# Patient Record
Sex: Female | Born: 1945 | Race: White | Hispanic: No | Smoking: Never smoker
Health system: Southern US, Community
[De-identification: ages and names within clinical notes are randomized; demographics above are authoritative.]

## PROBLEM LIST (undated history)

## (undated) DIAGNOSIS — I10 Essential (primary) hypertension: Secondary | ICD-10-CM

## (undated) DIAGNOSIS — T7840XA Allergy, unspecified, initial encounter: Secondary | ICD-10-CM

## (undated) DIAGNOSIS — E785 Hyperlipidemia, unspecified: Secondary | ICD-10-CM

## (undated) DIAGNOSIS — I350 Nonrheumatic aortic (valve) stenosis: Secondary | ICD-10-CM

## (undated) HISTORY — DX: Nonrheumatic aortic (valve) stenosis: I35.0

## (undated) HISTORY — DX: Allergy, unspecified, initial encounter: T78.40XA

## (undated) HISTORY — DX: Hyperlipidemia, unspecified: E78.5

## (undated) HISTORY — DX: Essential (primary) hypertension: I10

## (undated) HISTORY — PX: TUBAL LIGATION: SHX77

---

## 1998-06-24 ENCOUNTER — Other Ambulatory Visit: Admission: RE | Admit: 1998-06-24 | Discharge: 1998-06-24 | Payer: Self-pay | Admitting: Obstetrics & Gynecology

## 1999-07-16 ENCOUNTER — Other Ambulatory Visit: Admission: RE | Admit: 1999-07-16 | Discharge: 1999-07-16 | Payer: Self-pay | Admitting: Obstetrics & Gynecology

## 2000-08-18 ENCOUNTER — Other Ambulatory Visit: Admission: RE | Admit: 2000-08-18 | Discharge: 2000-08-18 | Payer: Self-pay | Admitting: Obstetrics & Gynecology

## 2001-01-14 ENCOUNTER — Encounter: Admission: RE | Admit: 2001-01-14 | Discharge: 2001-01-14 | Payer: Self-pay | Admitting: Family Medicine

## 2001-01-14 ENCOUNTER — Encounter: Payer: Self-pay | Admitting: Family Medicine

## 2001-01-21 ENCOUNTER — Ambulatory Visit (HOSPITAL_COMMUNITY): Admission: RE | Admit: 2001-01-21 | Discharge: 2001-01-21 | Payer: Self-pay | Admitting: Family Medicine

## 2001-09-08 ENCOUNTER — Other Ambulatory Visit: Admission: RE | Admit: 2001-09-08 | Discharge: 2001-09-08 | Payer: Self-pay | Admitting: Obstetrics & Gynecology

## 2002-09-19 ENCOUNTER — Other Ambulatory Visit: Admission: RE | Admit: 2002-09-19 | Discharge: 2002-09-19 | Payer: Self-pay | Admitting: Obstetrics & Gynecology

## 2003-12-05 ENCOUNTER — Other Ambulatory Visit: Admission: RE | Admit: 2003-12-05 | Discharge: 2003-12-05 | Payer: Self-pay | Admitting: Obstetrics & Gynecology

## 2004-08-13 ENCOUNTER — Ambulatory Visit: Payer: Self-pay | Admitting: Internal Medicine

## 2004-09-08 ENCOUNTER — Ambulatory Visit: Payer: Self-pay | Admitting: Internal Medicine

## 2005-01-22 ENCOUNTER — Other Ambulatory Visit: Admission: RE | Admit: 2005-01-22 | Discharge: 2005-01-22 | Payer: Self-pay | Admitting: Obstetrics & Gynecology

## 2005-04-07 ENCOUNTER — Ambulatory Visit: Payer: Self-pay | Admitting: Cardiology

## 2005-06-16 ENCOUNTER — Ambulatory Visit: Payer: Self-pay | Admitting: Cardiology

## 2005-07-01 ENCOUNTER — Ambulatory Visit: Payer: Self-pay | Admitting: Cardiology

## 2005-08-18 ENCOUNTER — Ambulatory Visit: Payer: Self-pay | Admitting: Cardiology

## 2006-01-05 ENCOUNTER — Ambulatory Visit: Payer: Self-pay | Admitting: Cardiology

## 2006-02-10 ENCOUNTER — Ambulatory Visit: Payer: Self-pay | Admitting: Cardiology

## 2006-06-22 ENCOUNTER — Ambulatory Visit: Payer: Self-pay | Admitting: Cardiology

## 2007-07-04 ENCOUNTER — Ambulatory Visit: Payer: Self-pay | Admitting: Cardiology

## 2007-07-06 ENCOUNTER — Ambulatory Visit: Payer: Self-pay | Admitting: Cardiology

## 2007-07-06 LAB — CONVERTED CEMR LAB
ALT: 21 units/L (ref 0–35)
AST: 19 units/L (ref 0–37)
Albumin: 4.2 g/dL (ref 3.5–5.2)
Alkaline Phosphatase: 59 units/L (ref 39–117)
BUN: 27 mg/dL — ABNORMAL HIGH (ref 6–23)
Bilirubin, Direct: 0.1 mg/dL (ref 0.0–0.3)
CO2: 28 meq/L (ref 19–32)
Calcium: 9.9 mg/dL (ref 8.4–10.5)
Chloride: 109 meq/L (ref 96–112)
Cholesterol: 173 mg/dL (ref 0–200)
Creatinine, Ser: 1 mg/dL (ref 0.4–1.2)
Direct LDL: 61.7 mg/dL
GFR calc Af Amer: 72 mL/min
GFR calc non Af Amer: 60 mL/min
Glucose, Bld: 102 mg/dL — ABNORMAL HIGH (ref 70–99)
HDL: 39.1 mg/dL (ref >39.0)
Potassium: 4.7 meq/L (ref 3.5–5.1)
Sodium: 143 meq/L (ref 135–145)
Total Bilirubin: 0.7 mg/dL (ref 0.3–1.2)
Total CHOL/HDL Ratio: 4.4
Total Protein: 6.8 g/dL (ref 6.0–8.3)
Triglycerides: 307 mg/dL (ref 0–149)
VLDL: 61 mg/dL — ABNORMAL HIGH (ref 0–40)

## 2007-10-10 ENCOUNTER — Ambulatory Visit: Payer: Self-pay | Admitting: Cardiology

## 2007-10-10 LAB — CONVERTED CEMR LAB
ALT: 24 units/L (ref 0–35)
AST: 21 units/L (ref 0–37)
Albumin: 3.9 g/dL (ref 3.5–5.2)
Alkaline Phosphatase: 54 units/L (ref 39–117)
Bilirubin, Direct: 0.1 mg/dL (ref 0.0–0.3)
Cholesterol: 161 mg/dL (ref 0–200)
HDL: 34.4 mg/dL — ABNORMAL LOW (ref >39.0)
LDL Cholesterol: 89 mg/dL (ref 0–99)
Total Bilirubin: 0.7 mg/dL (ref 0.3–1.2)
Total CHOL/HDL Ratio: 4.7
Total Protein: 6.9 g/dL (ref 6.0–8.3)
Triglycerides: 190 mg/dL — ABNORMAL HIGH (ref 0–149)
VLDL: 38 mg/dL (ref 0–40)

## 2007-10-11 ENCOUNTER — Ambulatory Visit: Payer: Self-pay | Admitting: Cardiology

## 2008-04-17 ENCOUNTER — Ambulatory Visit: Payer: Self-pay | Admitting: Cardiology

## 2009-08-21 ENCOUNTER — Encounter (INDEPENDENT_AMBULATORY_CARE_PROVIDER_SITE_OTHER): Payer: Self-pay | Admitting: *Deleted

## 2010-06-10 NOTE — Letter (Signed)
Summary: Colonoscopy Letter  [facility]  [address] [place], [place] [address]   Phone: [phone]  Fax: [phone]      August 21, 2009 MRN: [id number]   DANIQUE HARTSOUGH [address] [place], [place]  [address]   Dear Ms. BRIM,   According to your medical record, it is time for you to schedule a Colonoscopy. The American Cancer Society recommends this procedure as a method to detect early colon cancer. Patients with a family history of colon cancer, or a personal history of colon polyps or inflammatory bowel disease are at increased risk.  This letter has beeen generated based on the recommendations made at the time of your procedure. If you feel that in your particular situation this may no longer apply, please contact our office.  Please call our office at [phone] to schedule this appointment or to update your records at your earliest convenience.  Thank you for cooperating with [place] to provide you with the very best care possible.   Sincerely,  Hedwig Morton. Juanda Chance, M.D.  [facility] Gastroenterology Division [phone]

## 2010-06-18 ENCOUNTER — Encounter (INDEPENDENT_AMBULATORY_CARE_PROVIDER_SITE_OTHER): Payer: Self-pay | Admitting: *Deleted

## 2010-06-26 NOTE — Letter (Signed)
Summary: Pre Visit Letter Revised  [facility]  [address] [place], [place] [address]   Phone: [phone]  Fax: [phone]        06/18/2010 MRN: [id number] Alice [name] [address] [place], [place]  [address]             Procedure Date:  07/10/2010 @ 2:00   Recall colon-Dr. Juanda Chance   Welcome to the Gastroenterology Division at [facility].    You are scheduled to see a nurse for your pre-procedure visit on 06/26/2010 at 2:00 on the 3rd floor at [facility], [address]  We ask that you try to arrive at our office 15 minutes prior to your appointment time to allow for check-in.  Please take a minute to review the attached form.  If you answer "Yes" to one or more of the questions on the first page, we ask that you call the person listed at your earliest opportunity.  If you answer "No" to all of the questions, please complete the rest of the form and bring it to your appointment.    Your nurse visit will consist of discussing your medical and surgical history, your immediate family medical history, and your medications.   If you are unable to list all of your medications on the form, please bring the medication bottles to your appointment and we will list them.  We will need to be aware of both prescribed and over the counter drugs.  We will need to know exact dosage information as well.    Please be prepared to read and sign documents such as consent forms, a financial agreement, and acknowledgement forms.  If necessary, and with your consent, a friend or relative is welcome to sit-in on the nurse visit with you.  Please bring your insurance card so that we may make a copy of it.  If your insurance requires a referral to see a specialist, please bring your referral form from your primary care physician.  No co-pay is required for this nurse visit.     If you cannot keep your appointment, please call [phone] to cancel or reschedule prior to  your appointment date.  This allows [place] the opportunity to schedule an appointment for another patient in need of care.    Thank you for choosing [facility] for your medical needs.  We appreciate the opportunity to care for you.  Please visit [place] at our website  to learn more about our practice.  Sincerely, The Gastroenterology Division

## 2010-07-10 ENCOUNTER — Other Ambulatory Visit: Payer: Self-pay | Admitting: Internal Medicine

## 2010-09-23 NOTE — Assessment & Plan Note (Signed)
[facility]                            CARDIOLOGY OFFICE NOTE   Alice [name], Alice [name]                      MRN:          [id number]  DATE:10/11/2007                            DOB:          21-Apr-1946    PRIMARY CARDIOLOGIST:  Dr. Jesse Sans. Wall.   PRIMARY CARE PHYSICIAN:  Dr. Lacretia Nicks. Varney Baas.   HABITS:  Alice [name] is a very delightful 65 year old Caucasian female,  followed by Dr. Daleen Squibb, who returns today status post recent blood work  for hyperlipidemia.  She had laboratory work checked yesterday.  Results  as stated below.  Alice [name] saw Dr. Daleen Squibb back in February and was found  to have dyslipidemia and started on Tri-Chlor.  Since that time she  states she has also started fish oil, three capsules daily. She is  tolerating both medicines without problems.  It is unclear as to why her  triglycerides were so elevated.  Alice [name] states she has been very  diligent in trying to follow a low-carbohydrate diet.  She rarely has  alcohol; if anything, maybe one glass of wine a week.  She has a rather  sedentary life style.  She works in [employer] at [employer], so states she  basically sits for eight hours a day.  She does walk her dog in the  evenings, but otherwise is not very active.  She denies any episodes of  chest discomfort, lightheaded, dizziness, pre-syncope or syncopal  episodes or palpitations.  She states compliance with her medications.   PAST MEDICAL/SURGICAL HISTORY:  1. Mixed hyperlipidemia.  2. Hypertension.  3. Status post cesarean section in 1980.  4. History of colon polyps, status post colonoscopy in 2006.   REVIEW OF SYSTEMS:  As stated above, otherwise negative.   CURRENT MEDICATIONS:  1. Tri-Chlor.  2. Lisinopril/hydrochlorothiazide 20/12.5 mg.  3. Aspirin 81 mg.  4. Fish oil, three cap daily.  5. Vitamin D.   CLINICAL DATA:  Blood work obtained on October 10, 2007:  AST 21, ALT 24,  total cholesterol 161, triglycerides 190, HDL  34.4, LDL 89.   PHYSICAL EXAMINATION:  VITAL SIGNS:  Weight 147 pounds.  Weight is down  3 pounds.  Blood pressure 100/70, heart rate 80.  GENERAL:  Alice [name] was in no acute distress.  HEENT:  Unremarkable.  NECK:  Supple without lymphadenopathy.  No bruits, no jugular venous  distention.  LUNGS:  Clear to auscultation bilaterally.  CARDIOVASCULAR:  S1 and S2.  Regular rate and rhythm.  ABDOMEN:  Soft, nontender.  Positive bowel sounds.  EXTREMITIES:  Lower extremities without clubbing, cyanosis or edema.  Pulses intact.  NEUROLOGIC:  Alert and oriented x3.  SKIN:  Warm and dry.   IMPRESSION:  1. Hypertension, well-controlled.  2. Dyslipidemia, much improvement in blood work since initiation of      Tri-Chlor and fish oil.  3. Sedentary life style:  Once again have encouraged Alice [name] to      increase her activity level.   FOLLOWUP:  Will have Alice [name] follow up with Dr. Daleen Squibb in six months for  a routine visit.  She knows to call sooner if she has any problems.  Blood work has been reviewed with her.  The patient has received a copy.      Dorian Pod, ACNP  Electronically Signed      Jesse Sans. Daleen Squibb, MD, [facility]  Electronically Signed   MB/MedQ  DD: 10/11/2007  DT: 10/11/2007  Job #: [id number]   cc:   Freddy Finner, M.D.

## 2010-09-23 NOTE — Assessment & Plan Note (Signed)
[facility]CARE                            CARDIOLOGY OFFICE NOTE   MATRACA, HUNKINS                      MRN:          [id number]  DATE:07/04/2007                            DOB:          12/31/1945    Ms. Freitas returns today for management of the following issues.  1. Mixed hyperlipidemia.  She had excellent response to Tricor and      diet.  She has some weight back this winter, but will increase her      walking come spring.  She tries to walk about 20 minutes per day.      She is having no angina or ischemic symptoms.  She is due blood      work.  2. Hypertension.  She had an excellent response to lisinopril      hydrochlorothiazide.  She continues have good blood pressure      control.  3. Antiplatelet therapy for primary prevention.   Her meds day, Tricor 148 mg daily, lisinopril hydrochlorothiazide  20/12.5 daily, aspirin 81 mg a day.   Her blood pressure today is 106/66, her pulse is 81 and regular.  Weight  is 150.  HEENT:  Normocephalic, atraumatic.  PERRL.  Extraocular is intact.  Sclera clear.  Facial symmetry is normal.  Dentition satisfactory.  Neck is supple.  Carotids are equal bilateral bruits, no JVD.  Thyroid  is not enlarged.  Trachea is midline.  LUNGS:  Clear.  HEART:  Reveals a nondisplaced PMI.  Normal S1-S2.  ABDOMEN:  Soft, good bowel sounds.  No midline bruits.  EXTREMITIES:  No cyanosis or clubbing.  Trace edema.  Pulses are intact.  NEURO:  Exam is intact.  Skin is unremarkable.   EKG is completely normal except for some nonspecific T-wave changes.   Ms. Plaia is doing well.  I have asked her to increase her walking as  the daylight lengthen and also to try to decrease her weight again.  Will have her return for fasting lipids and LFTs.  Will also check a  Chem-7.   I will plan on seeing her back in a year otherwise.     Thomas C. Daleen Squibb, MD, [facility]  Electronically Signed    TCW/MedQ  DD: 07/04/2007  DT:  07/05/2007  Job #: [id number]   cc:   Freddy Finner, M.D.

## 2010-09-23 NOTE — Assessment & Plan Note (Signed)
[facility]                            CARDIOLOGY OFFICE NOTE   JERENE, YEAGER                      MRN:          [id number]  DATE:04/17/2008                            DOB:          July 15, 1945    Ms. Titterington comes in today for mixed hyperlipidemia.   She was little frustrated that she could not get generic fenofibrate at  [facility].  We will reinvestigate this.  She is currently on TriCor 148  mg a day.   She is also on lisinopril/hydrochlorothiazide 20/12.5 for hypertension  and aspirin 81 mg a day.  She continues with fish oil 1000 mg per day.  She is taking vitamin D.   PHYSICAL EXAMINATION:  VITAL SIGNS:  Her blood pressures have been  excellent and was also very good at Dr. Donnetta Hail office recently.  Today,  it is 100/70, her pulse is 88 and regular, her weight is 152.  HEENT:  Normal.  NECK:  Carotid upstrokes are equal bilaterally without bruits.  No JVD.  Thyroid is not enlarged.  Trachea is midline.  LUNGS:  Clear to auscultation and percussion.  HEART:  Regular rate and rhythm.  No gallop.  ABDOMEN:  Soft, good bowel sounds.  No midline bruit.  There is no  hepatomegaly.  EXTREMITIES:  No cyanosis, clubbing, or edema.  Pulses are intact.  NEUROLOGIC:  Intact.   Ms. Daniel is doing remarkably well.  We have changed her TriCor to  fenofibrate 160 mg a day.  I hope this will save her some money.  Encouraged to keep her weight down and stay active.  We will plan on  seeing her back again in 6 months.     Thomas C. Daleen Squibb, MD, [facility]  Electronically Signed    TCW/MedQ  DD: 04/17/2008  DT: 04/18/2008  Job #: [id number]   cc:   Freddy Finner, M.D.

## 2010-09-26 NOTE — Assessment & Plan Note (Signed)
[facility]                              CARDIOLOGY OFFICE NOTE   Alice [name], Alice [name]                      MRN:          [id number]  DATE:01/05/2006                            DOB:          [birth year]    HISTORY OF PRESENT ILLNESS:  The patient returns today for further followup  of her mixed hyperlipidemia, family history of stroke in her father, and  border hypertension.  We noticed her blood pressure was elevated on her last  visit.  She has had one elevated reading since then at another doctor's  office.   Because her triglycerides did not drop despite a fairly strict diet, we  added Tricor 148 mg daily.  Her total cholesterol has now dropped from 216  to 270 and triglycerides from 402 to 174.  HDL has increased from 42 to 44  (but was initially in the mid 30s) and her LDL is below 100 at 91.  I think  we have one falsely low reading of LDL at 51.5 in the past.   She feels remarkably well.   PHYSICAL EXAMINATION:  VITAL SIGNS:  Her blood pressure is 140/78 today.  Pulse 78 and regular.  I rechecked it and it was actually 160.  Her weight  is 148 which is down 2 pounds.  GENERAL:  She is in no acute distress.  NECK:  Carotid upstrokes are equal bilaterally without bruits.  There is no  JVD.  Thyroid is not palpable or enlarged or tender.  LUNGS:  Clear.  HEART:  Reveals regular rate and rhythm without an S4.  ABDOMEN:  Exam is soft.  No midline bruits.  EXTREMITIES:  No cyanosis, clubbing or edema.  Peripheral pulses are  present.   EKG:  EKG is essentially normal.   ASSESSMENT AND PLAN:  1. Mixed hyperlipidemia with excellent response with diet and with Tricor.      I am very pleased with her increase in HDL and near normalization of      her triglycerides and lowering of her total cholesterol as well.  2. Probable hypertension.   PLAN:  1. Check TSH.  2. Check BNP.  3. Check blood pressures at [facility] and give [place] a call.   If she is      running systolics consistently above 135, we will implement anti-      hypertensive therapy.  This is true,      particularly, in light of her family history of stroke in her dad.      Otherwise, we will plan on seeing her back in six months.  We renewed      her Tricor.                               Thomas C. Daleen Squibb, MD, [facility]    TCW/MedQ  DD:  01/05/2006  DT:  01/06/2006  Job #:  [id number]   cc:   Freddy Finner, MD

## 2010-09-26 NOTE — Assessment & Plan Note (Signed)
[facility]                            CARDIOLOGY OFFICE NOTE   SCHWANDA, ZIMA                      MRN:          [id number]  DATE:06/22/2006                            DOB:          01-10-1946    Ms. Senat returns today for further management of the following issues:  1. Mixed hyperlipidemia. She has had an excellent response to Tricor      and diet. She is due lipids and LFTs in April. Please see my      previous note.  2. Hypertension. She has had a brilliant response to      lisinopril/hydrochlorothiazide 20/12.5. Chemistries have been      within normal limits on this medication in October 2007.  3. She is also on aspirin 81 mg a day.   She has no ischemic symptoms or cardiovascular complaints today. She  denies any orthopnea, PND or peripheral edema.   Her blood pressure is 110/70. Pulse 88 and regular. She is in sinus  rhythm with nonspecific ST segment changes. This is stable.   Her weight is 146, down 2.  HEENT: Normocephalic, atraumatic. PERRLA. Extra-ocular movements intact.  Sclerae are clear.  NECK: Is supple.  There is no JVD.  Thyroid is not enlarged. Trachea is  midline.  LUNGS:  Are clear.  HEART: Reveals a regular rate and rhythm without gallop, rub, or murmur.  ABDOMEN: Soft with good bowel sounds. There is no hepatic tenderness or  hepatic enlargement.  EXTREMITIES: Reveals no edema. Pulses are intact.  NEURO: Intact.   ASSESSMENT/PLAN:  Mrs. Curington is doing well on the current medical  program. She offers no cardiovascular complaints when questioned. I will  have her return in April for a comprehensive metabolic panel and lipids.  Will plan on seeing her back in a year otherwise.     Thomas C. Daleen Squibb, MD, [facility]  Electronically Signed    TCW/MedQ  DD: 06/22/2006  DT: 06/22/2006  Job #: [id number]   cc:   Freddy Finner, M.D.

## 2010-11-26 ENCOUNTER — Telehealth: Payer: Self-pay | Admitting: *Deleted

## 2010-11-26 NOTE — Telephone Encounter (Signed)
Patient is overdue for colonoscopy for follow up of adenomatous polyps seen on 2006 procedure. Patient was scheduled for colonoscopy in March 2012, but cancelled. I have left a message for her to call back.

## 2010-11-27 NOTE — Telephone Encounter (Signed)
-----   Message -----    From: Holli Humbles    Sent: 11/27/2010   9:08 AM      To: Vernia Buff, CMA  Pt. returned call. She is out of town and will CB next week when she returns.

## 2011-04-29 ENCOUNTER — Ambulatory Visit (INDEPENDENT_AMBULATORY_CARE_PROVIDER_SITE_OTHER): Payer: BC Managed Care – PPO

## 2011-04-29 DIAGNOSIS — N898 Other specified noninflammatory disorders of vagina: Secondary | ICD-10-CM

## 2011-04-29 DIAGNOSIS — N760 Acute vaginitis: Secondary | ICD-10-CM

## 2011-10-20 ENCOUNTER — Other Ambulatory Visit: Payer: Self-pay | Admitting: Internal Medicine

## 2012-01-05 ENCOUNTER — Ambulatory Visit (INDEPENDENT_AMBULATORY_CARE_PROVIDER_SITE_OTHER): Payer: Medicare Other | Admitting: Family Medicine

## 2012-01-05 VITALS — BP 120/68 | HR 91 | Temp 98.8°F | Resp 16 | Ht 63.0 in | Wt 164.0 lb

## 2012-01-05 DIAGNOSIS — H109 Unspecified conjunctivitis: Secondary | ICD-10-CM

## 2012-01-05 MED ORDER — OFLOXACIN 0.3 % OP SOLN: OPHTHALMIC | Status: AC

## 2012-01-05 NOTE — Progress Notes (Signed)
Subjective: Patient has been having problems with pain in her right eye since she got up this morning. She took her contacts out. She has been doing a lot of yard work. Knows of no foreign bodies.  Objective: Mild redness of. No foreign body seen right eye.. fluoresceined screen was done and no abrasions are noted.  Assessment: Conjunctivitis  Plan: Ofloxacin eyedrops. Return if problems.

## 2012-01-05 NOTE — Patient Instructions (Signed)
Return if worse or no better.   Contacts out until it feels okay for 1-2 days  Use drops as directed

## 2012-01-16 ENCOUNTER — Other Ambulatory Visit: Payer: Self-pay | Admitting: Internal Medicine

## 2012-01-17 NOTE — Telephone Encounter (Signed)
Please pull chart.

## 2012-01-18 NOTE — Telephone Encounter (Signed)
Needs office visit before out

## 2012-01-18 NOTE — Telephone Encounter (Signed)
Chart pulled to PA pool at nurse station [phone]

## 2012-03-16 ENCOUNTER — Encounter: Payer: Self-pay | Admitting: Internal Medicine

## 2012-03-16 ENCOUNTER — Ambulatory Visit (INDEPENDENT_AMBULATORY_CARE_PROVIDER_SITE_OTHER): Payer: Medicare Other | Admitting: Internal Medicine

## 2012-03-16 VITALS — BP 108/78 | HR 81 | Temp 98.1°F | Resp 16 | Ht 63.75 in | Wt 165.6 lb

## 2012-03-16 DIAGNOSIS — R8281 Pyuria: Secondary | ICD-10-CM

## 2012-03-16 DIAGNOSIS — R82998 Other abnormal findings in urine: Secondary | ICD-10-CM

## 2012-03-16 DIAGNOSIS — Z0000 Encounter for general adult medical examination without abnormal findings: Secondary | ICD-10-CM

## 2012-03-16 DIAGNOSIS — E785 Hyperlipidemia, unspecified: Secondary | ICD-10-CM

## 2012-03-16 DIAGNOSIS — E781 Pure hyperglyceridemia: Secondary | ICD-10-CM | POA: Insufficient documentation

## 2012-03-16 DIAGNOSIS — J309 Allergic rhinitis, unspecified: Secondary | ICD-10-CM

## 2012-03-16 DIAGNOSIS — Z23 Encounter for immunization: Secondary | ICD-10-CM

## 2012-03-16 DIAGNOSIS — Z6827 Body mass index (BMI) 27.0-27.9, adult: Secondary | ICD-10-CM | POA: Insufficient documentation

## 2012-03-16 DIAGNOSIS — I10 Essential (primary) hypertension: Secondary | ICD-10-CM

## 2012-03-16 DIAGNOSIS — Z6828 Body mass index (BMI) 28.0-28.9, adult: Secondary | ICD-10-CM

## 2012-03-16 DIAGNOSIS — B356 Tinea cruris: Secondary | ICD-10-CM

## 2012-03-16 LAB — POCT URINALYSIS DIPSTICK
Bilirubin, UA: NEGATIVE
Glucose, UA: NEGATIVE
Ketones, UA: NEGATIVE
Leukocytes, UA: NEGATIVE
pH, UA: 7

## 2012-03-16 LAB — CBC WITH DIFFERENTIAL/PLATELET
Basophils Absolute: 0.1 10*3/uL (ref 0.0–0.1)
Lymphocytes Relative: 30 % (ref 12–46)
Lymphs Abs: 3 10*3/uL (ref 0.7–4.0)
Neutro Abs: 5.7 10*3/uL (ref 1.7–7.7)
Neutrophils Relative %: 56 % (ref 43–77)
Platelets: 279 10*3/uL (ref 150–400)
RBC: 4.98 MIL/uL (ref 3.87–5.11)
WBC: 10.1 10*3/uL (ref 4.0–10.5)

## 2012-03-16 LAB — COMPREHENSIVE METABOLIC PANEL
ALT: 22 U/L (ref 0–35)
AST: 22 U/L (ref 0–37)
CO2: 27 mEq/L (ref 19–32)
Calcium: 9.5 mg/dL (ref 8.4–10.5)
Chloride: 101 mEq/L (ref 96–112)
Sodium: 138 mEq/L (ref 135–145)
Total Protein: 7.2 g/dL (ref 6.0–8.3)

## 2012-03-16 LAB — LIPID PANEL
Cholesterol: 163 mg/dL (ref 0–200)
VLDL: 54 mg/dL — ABNORMAL HIGH (ref 0–40)

## 2012-03-16 LAB — POCT UA - MICROSCOPIC ONLY

## 2012-03-16 MED ORDER — KETOCONAZOLE 2 % EX CREA: TOPICAL_CREAM | Freq: Every day | CUTANEOUS | Status: DC

## 2012-03-16 MED ORDER — MOMETASONE FUROATE 50 MCG/ACT NA SUSP: 2.0000 | Freq: Every day | NASAL | Status: DC

## 2012-03-16 MED ORDER — LORATADINE 10 MG PO TBDP: 10.0000 mg | ORAL_TABLET | Freq: Every day | ORAL | Status: DC

## 2012-03-16 NOTE — Progress Notes (Deleted)
  Subjective:    Patient ID: Alice [name], female    DOB: 10/18/1945, 66 y.o.   MRN: [id number]  HPI    Review of Systems     Objective:   Physical Exam            Assessment & Plan:

## 2012-03-16 NOTE — Progress Notes (Signed)
  Subjective:    Patient ID: Alice [name], female    DOB: November 28, 1945, 66 y.o.   MRN: [id number]  HPI    Review of Systems  Constitutional: Negative.   HENT: Positive for postnasal drip.   Eyes: Negative.   Respiratory: Negative.   Cardiovascular: Negative.   Gastrointestinal: Negative.   Genitourinary: Negative.   Musculoskeletal: Negative.   Skin: Negative.   Neurological: Negative.   Hematological: Negative.   Psychiatric/Behavioral: Negative.        Objective:   Physical Exam        Assessment & Plan:

## 2012-03-16 NOTE — Progress Notes (Addendum)
  Subjective:    Patient ID: Alice [name], female    DOB: 04-23-1946, 66 y.o.   MRN: [id number]  CC: 66 yo W F here for routine healthcare and c/o postnasal drip.  HPI Patient Active Problem List  Diagnosis  . Hyperlipidemia  . Allergic rhinitis  . HTN (hypertension)  . BMI 28.0-28.9,adult     Pt is retired and has been enjoying life overall and is here for routine healthcare and post nasal drip.  She c/o of a lump in the back of her throat.  She has tried Flonase, Mucinex and saline spray w/o resolution.  She does not have to blow her nose or cough regularly.  She denies trouble w/sinus pressure and hoarseness.  However, she does c/o a bad taste in her mouth and indigestion. She has no true dysphagia.   Pt c/o itching under her arms and in her groin area.  She denies any abnormal discharge.  This is been present for many months despite the things she has experimented with.  She was treated with Flagyl w/in the past year for BV.  We discussed the use of probiotics to prevent recurring BV.  She says that she recently started on probiotics and will check to see if the ones she bought contain the recommended probiotics. She is not sexually active and has no vaginal discharge. Her nuisance symptoms include a feeling of warmness and slight irritation.   Pt has had her last mammogram w/in the past yr and the USPSTF currently recommends screening mammograms q 66yrs for her age group.    Immunizations up todate   Review of Systems Noncontributory///see record under CMA   No depression/no risk of falls/no pending vision difficulties Objective:   Physical Exam General: 66 yo F appears stated age and is pleasant and cooperative. Vitals:  Filed Vitals:   03/16/12 1503  BP: 108/78  Pulse: 81  Temp: 98.1 F (36.7 C)  Resp: 16  HEENT: Nontraumatic,PERRLA, EOM conj. Nares boggy with allergic signs/oral pharynx clear/non-nodes or thyromegaly Heart: RRR/No murmur or carotid bruit Axillary  areas without rash Breasts without masses or tenderness Lungs: CTA bilaterally Abdomen with no organomegaly or masses Mild hyperpig areas In inguinal area bilaterally Where moist Spine straight Straight leg raise negative Extremities with no edema/peripheral pulses full Neurological intact Mood stable/affect good     Assessment & Plan:  Annual physical examination  1. Annual physical exam    2. Need for prophylactic vaccination and inoculation against influenza    3. Hyperlipidemia    4. Allergic rhinitis    5. HTN (hypertension)    6. Tinea cruris    7. Pyuria    8. BMI 28.0-28.9,adult      1)Axillary pruritus uncertain etiology-to try trial variety of deodorants 2) Recurrent BV-Discussed preventing BV w/ probiotics and which types of probiotics to select. 3) Tinea ? In groin-Trial of Nizoral 4) Allergies -Probable cause of throat symptoms  Will try Claritin plus Nasonex for one to 2 months and if not resolved will consider ENT referral  5) Pap smear at December 2012 within normal limits 6) colonoscopy next due 2016 7) Weight loss needed 8) will send in regular medications after labs reviewed

## 2012-03-16 NOTE — Patient Instructions (Addendum)
Use one spray in each nostril twice a day of nasonex use nizoral for the groin 1-2 times a week if needed Bifidobacterium plus lactobacillus in the probiotics([employer])

## 2012-03-18 ENCOUNTER — Encounter: Payer: Self-pay | Admitting: Internal Medicine

## 2012-03-18 LAB — URINE CULTURE
Colony Count: NO GROWTH
Organism ID, Bacteria: NO GROWTH

## 2012-03-18 MED ORDER — LISINOPRIL-HYDROCHLOROTHIAZIDE 20-12.5 MG PO TABS: 1.0000 | ORAL_TABLET | Freq: Every day | ORAL | Status: DC

## 2012-03-18 MED ORDER — FENOFIBRATE 160 MG PO TABS: 160.0000 mg | ORAL_TABLET | Freq: Every day | ORAL | Status: DC

## 2012-03-18 NOTE — Addendum Note (Signed)
Addended by: Tonye Pearson on: 03/18/2012 02:32 PM   Modules accepted: Orders

## 2012-06-15 ENCOUNTER — Other Ambulatory Visit: Payer: Self-pay | Admitting: Physician Assistant

## 2013-01-17 ENCOUNTER — Other Ambulatory Visit: Payer: Self-pay | Admitting: Physician Assistant

## 2013-02-20 ENCOUNTER — Ambulatory Visit (INDEPENDENT_AMBULATORY_CARE_PROVIDER_SITE_OTHER): Payer: Medicare Other | Admitting: *Deleted

## 2013-02-20 DIAGNOSIS — Z23 Encounter for immunization: Secondary | ICD-10-CM

## 2013-04-12 ENCOUNTER — Ambulatory Visit (INDEPENDENT_AMBULATORY_CARE_PROVIDER_SITE_OTHER): Payer: Medicare Other | Admitting: Internal Medicine

## 2013-04-12 ENCOUNTER — Encounter: Payer: Self-pay | Admitting: Internal Medicine

## 2013-04-12 VITALS — BP 124/70 | HR 88 | Temp 98.2°F | Resp 16 | Ht 63.75 in | Wt 169.2 lb

## 2013-04-12 DIAGNOSIS — J309 Allergic rhinitis, unspecified: Secondary | ICD-10-CM

## 2013-04-12 DIAGNOSIS — E785 Hyperlipidemia, unspecified: Secondary | ICD-10-CM

## 2013-04-12 DIAGNOSIS — Z0000 Encounter for general adult medical examination without abnormal findings: Secondary | ICD-10-CM

## 2013-04-12 DIAGNOSIS — I10 Essential (primary) hypertension: Secondary | ICD-10-CM

## 2013-04-12 DIAGNOSIS — Z23 Encounter for immunization: Secondary | ICD-10-CM

## 2013-04-12 DIAGNOSIS — Z6828 Body mass index (BMI) 28.0-28.9, adult: Secondary | ICD-10-CM

## 2013-04-12 LAB — POCT URINALYSIS DIPSTICK
Glucose, UA: NEGATIVE
Nitrite, UA: NEGATIVE
Urobilinogen, UA: 0.2
pH, UA: 7

## 2013-04-12 LAB — CBC
MCH: 31 pg (ref 26.0–34.0)
MCHC: 34.4 g/dL (ref 30.0–36.0)
Platelets: 299 10*3/uL (ref 150–400)

## 2013-04-12 LAB — COMPREHENSIVE METABOLIC PANEL
ALT: 27 U/L (ref 0–35)
Alkaline Phosphatase: 76 U/L (ref 39–117)
Sodium: 142 mEq/L (ref 135–145)
Total Bilirubin: 0.5 mg/dL (ref 0.3–1.2)
Total Protein: 7 g/dL (ref 6.0–8.3)

## 2013-04-12 LAB — POCT UA - MICROSCOPIC ONLY
Casts, Ur, LPF, POC: NEGATIVE
Mucus, UA: POSITIVE
Yeast, UA: NEGATIVE

## 2013-04-12 LAB — LIPID PANEL
LDL Cholesterol: 63 mg/dL (ref 0–99)
VLDL: 74 mg/dL — ABNORMAL HIGH (ref 0–40)

## 2013-04-12 NOTE — Progress Notes (Signed)
   Subjective:    Patient ID: Alice [name], female    DOB: 04/25/1946, 67 y.o.   MRN: [id number]  HPI    Review of Systems  Constitutional: Negative.   HENT: Positive for postnasal drip.   Eyes: Negative.   Respiratory: Negative.   Cardiovascular: Negative.   Gastrointestinal: Negative.   Endocrine: Negative.   Genitourinary: Negative.   Musculoskeletal: Positive for back pain.  Skin: Negative.   Allergic/Immunologic: Negative.   Neurological: Negative.   Hematological: Negative.   Psychiatric/Behavioral: Negative.        Objective:   Physical Exam        Assessment & Plan:

## 2013-04-12 NOTE — Progress Notes (Signed)
   Subjective:    Patient ID: Alice [name], female    DOB: 1945/08/19, 67 y.o.   MRN: [id number]  HPIhere for cpe  Last CPE- 12/13 Last mammo- 12/12 Dentist- 8/14 Eye- tomorrow [place]- 2006 Flu- 10/14 Pneumovax- will have today  Has occasional back pain. Relieved with tylenol. Exercises at [facility] [employer]. Has noticed slight weight gain despite exercise. This has occurred with retirement 1 1/2 years ago.  Has post nasal drip in the fall. Improved with nasonex and OTC antihistamines.  Nocturia x1  No further rash.  Review of Systems  Constitutional: Negative for fever, activity change, appetite change, fatigue and unexpected weight change.  HENT: Negative.   Eyes: Negative.   Respiratory: Negative for cough, chest tightness and shortness of breath.   Cardiovascular: Negative for chest pain, palpitations and leg swelling.  Gastrointestinal: Negative.   Endocrine: Negative.   Genitourinary: Negative.   Allergic/Immunologic: Negative.   Neurological: Negative.   Hematological: Negative.   Psychiatric/Behavioral: Negative.        Objective:   Physical Exam  Nursing note and vitals reviewed. Constitutional: She is oriented to person, place, and time. She appears well-developed and well-nourished. No distress.  HENT:  Head: Normocephalic.  Right Ear: Tympanic membrane, external ear and ear canal normal.  Left Ear: Tympanic membrane, external ear and ear canal normal.  Nose: Nose normal.  Mouth/Throat: Oropharynx is clear and moist.  Eyes: Conjunctivae and EOM are normal. Pupils are equal, round, and reactive to light.  Neck: Normal range of motion. Neck supple. No thyromegaly present.  Cardiovascular: Normal rate, regular rhythm, normal heart sounds and intact distal pulses.   No murmur heard. No bruit  Pulmonary/Chest: Effort normal and breath sounds normal.  Abdominal: Soft. Bowel sounds are normal. She exhibits no mass. There is no tenderness.  Musculoskeletal: Normal range of  motion. She exhibits no edema and no tenderness.  Lymphadenopathy:    She has no cervical adenopathy.  Neurological: She is alert and oriented to person, place, and time. She has normal reflexes. No cranial nerve deficit.  Skin: Skin is warm and dry. No rash noted. No erythema.  Psychiatric: She has a normal mood and affect. Her behavior is normal. Judgment and thought content normal.       Assessment & Plan:  Routine general medical examination at a health care facility - Plan: POCT UA - Microscopic Only, POCT urinalysis dipstick  Hyperlipidemia- continue fenofibrate  HTN (hypertension)- continue lisinopril-hydrochlorothiazide 20-12.5  Allergic rhinitis- continue nasonex, loratadine  BMI 28.0-28.9,adult  I have completed the patient encounter in its entirety as documented by FNP Leone Payor, with editing by me where necessary. Kaelynn Igo P. Merla Riches, M.D.

## 2013-04-14 MED ORDER — LISINOPRIL-HYDROCHLOROTHIAZIDE 20-12.5 MG PO TABS: 1.0000 | ORAL_TABLET | Freq: Every day | ORAL | Status: DC

## 2013-04-14 MED ORDER — MOMETASONE FUROATE 50 MCG/ACT NA SUSP: 2.0000 | Freq: Every day | NASAL | Status: DC

## 2013-04-14 MED ORDER — FENOFIBRATE 160 MG PO TABS: 160.0000 mg | ORAL_TABLET | Freq: Every day | ORAL | Status: DC

## 2013-04-19 ENCOUNTER — Encounter: Payer: Self-pay | Admitting: Internal Medicine

## 2013-06-01 ENCOUNTER — Encounter: Payer: Self-pay | Admitting: Internal Medicine

## 2014-01-10 ENCOUNTER — Encounter: Payer: Self-pay | Admitting: *Deleted

## 2014-01-12 ENCOUNTER — Ambulatory Visit (INDEPENDENT_AMBULATORY_CARE_PROVIDER_SITE_OTHER): Payer: Medicare Other | Admitting: Family Medicine

## 2014-01-12 VITALS — BP 132/82 | HR 84 | Temp 98.2°F | Resp 17 | Ht 64.0 in | Wt 173.0 lb

## 2014-01-12 DIAGNOSIS — S90569A Insect bite (nonvenomous), unspecified ankle, initial encounter: Secondary | ICD-10-CM

## 2014-01-12 DIAGNOSIS — W57XXXA Bitten or stung by nonvenomous insect and other nonvenomous arthropods, initial encounter: Secondary | ICD-10-CM

## 2014-01-12 DIAGNOSIS — R21 Rash and other nonspecific skin eruption: Secondary | ICD-10-CM

## 2014-01-12 MED ORDER — TRIAMCINOLONE ACETONIDE 0.1 % EX CREA: 1.0000 "application " | TOPICAL_CREAM | Freq: Two times a day (BID) | CUTANEOUS | Status: DC

## 2014-01-12 NOTE — Patient Instructions (Signed)
try the steroid cream twice per day, aveeno lotion if needed, zyrtec or benadryl if needed for itching - but these can cause sedation and dizziness, so use caution in taking this medicine.   If any spread or worsening of your rash , or new or worsening symptoms - return for recheck.   If rash not improved in next 2 weeks - recheck.

## 2014-01-12 NOTE — Progress Notes (Signed)
Subjective:    Patient ID: Alice [name], female    DOB: 1945-08-27, 68 y.o.   MRN: [id number]  This chart was scribed for Meredith Staggers, MD by Gwenevere Abbot, ED scribe. This patient was seen in room Room/bed 3 and the patient's care was started at 12:17 PM.  WUJ:[id number], Alice Lemon, MD  Chief Complaint  Patient presents with  . Rash    ALL over body      HPI Alice [name] is a 68 y.o. female who presents to [facility] with concerns of rashes on the upper and lower extremities.   Pt reports that she became sunburned on her upper arms bilaterally, approximately 1 month ago, with associated symptoms of itching, bumps, and redness. Pt reports that she is still dealing with symptoms. Pt reports that she has attempted to use cortisone, without relief.   Pt noticed spots on ankles bilaterally, predominantly on the right, onset two weeks ago. Pt reports that she has experienced redness. Pt denies using any new lotions or detergents prior to rash onset. Pt reports that she has changed products since onset in attempt to relieve symptoms, without relief.   Patient Active Problem List   Diagnosis Date Noted  . Hyperlipidemia 03/16/2012  . Allergic rhinitis 03/16/2012  . HTN (hypertension) 03/16/2012  . BMI 28.0-28.9,adult 03/16/2012   No past medical history on file. Past Surgical History  Procedure Laterality Date  . Cesarean section    . Tubal ligation     No Known Allergies Prior to Admission medications   Medication Sig Start Date End Date Taking? Authorizing Provider  aspirin 81 MG tablet Take 81 mg by mouth daily.   Yes Historical Provider, MD  fenofibrate 160 MG tablet Take 1 tablet (160 mg total) by mouth daily. 04/14/13  Yes Tonye Pearson, MD  fish oil-omega-3 fatty acids 1000 MG capsule Take 2 g by mouth daily.   Yes Historical Provider, MD  ketoconazole (NIZORAL) 2 % cream Apply topically daily. 03/16/12  Yes Tonye Pearson, MD  Lactobacillus (PROBIOTIC ACIDOPHILUS PO)  Take by mouth.   Yes Historical Provider, MD  lisinopril-hydrochlorothiazide (PRINZIDE,ZESTORETIC) 20-12.5 MG per tablet Take 1 tablet by mouth daily. 04/14/13  Yes Tonye Pearson, MD  loratadine (CLARITIN REDITABS) 10 MG dissolvable tablet Take 1 tablet (10 mg total) by mouth daily. 03/16/12  Yes Tonye Pearson, MD  mometasone (NASONEX) 50 MCG/ACT nasal spray Place 2 sprays into the nose daily. 04/14/13  Yes Tonye Pearson, MD  OVER THE COUNTER MEDICATION Vitamin D 5000 iu once daily   Yes Historical Provider, MD   History   Social History  . Marital Status: Unknown    Spouse Name: N/A    Number of Children: N/A  . Years of Education: N/A   Occupational History  . Not on file.   Social History Main Topics  . Smoking status: Never Smoker   . Smokeless tobacco: Not on file  . Alcohol Use: Yes  . Drug Use: No  . Sexual Activity: Not on file   Other Topics Concern  . Not on file   Social History Narrative   Divorced. Education: [employer]. Exercise: Yes.    Review of Systems  Skin:       Itchiness       Objective:   Physical Exam  Nursing note and vitals reviewed. Constitutional: She is oriented to person, place, and time. She appears well-developed and well-nourished.  HENT:  Head: Normocephalic and atraumatic.  Eyes:  EOM are normal.  Neck: Normal range of motion. Neck supple.  Cardiovascular: Normal rate.   Pulmonary/Chest: Effort normal.  Musculoskeletal: Normal range of motion.  Neurological: She is alert and oriented to person, place, and time.  Skin: Skin is warm and dry. Rash noted. There is erythema.  Lower Extremities: A few slighty erythemitis patches, not hyper pigmented on the right and left lower legs. No surrounding erythema. No lesions between toes, or on feet. No ulceration.   Upper Arms: Right upper arm, approximately two small patches, 2 cm in diameter with slight excoriation and thickening. Left upper arm, approximately 5 excoriated patches,  slightly dry skin, ranging from 2-3 mm up to 1.5 cm, on the dorsal surface of the upper arm bilaterally. No surrounding erythema or vesicles.   Psychiatric: She has a normal mood and affect. Her behavior is normal.        Filed Vitals:   01/12/14 1119  BP: 132/82  Pulse: 84  Temp: 98.2 F (36.8 C)  TempSrc: Oral  Resp: 17  Height:  (1.626 m)  Weight: 173 lb (78.472 kg)  SpO2: 92%    Assessment & Plan:   Alice [name] is a 68 y.o. female Rash and nonspecific skin eruption - Plan: triamcinolone cream (KENALOG) 0.1 %  Insect bite of ankle, unspecified laterality, initial encounter - Plan: triamcinolone cream (KENALOG) 0.1 %  Suspect insect bites of ankles, and few dry patches upper arms - DDX contact derm vs psoriasis/guttate psoriasis possible with scale. Trial of topical triamcinolone, aveeno otc prn, and antihistamine if needed, but SED. recheck in next 2 weeks if not improving - sooner if worse.   Meds ordered this encounter  Medications  . triamcinolone cream (KENALOG) 0.1 %    Sig: Apply 1 application topically 2 (two) times daily.    Dispense:  30 g    Refill:  0   Patient Instructions  try the steroid cream twice per day, aveeno lotion if needed, zyrtec or benadryl if needed for itching - but these can cause sedation and dizziness, so use caution in taking this medicine.   If any spread or worsening of your rash , or new or worsening symptoms - return for recheck.   If rash not improved in next 2 weeks - recheck.      I personally performed the services described in this documentation, which was scribed in my presence. The recorded information has been reviewed and considered, and addended by me as needed.

## 2014-03-13 ENCOUNTER — Ambulatory Visit: Payer: Medicare Other | Admitting: *Deleted

## 2014-03-13 ENCOUNTER — Ambulatory Visit (INDEPENDENT_AMBULATORY_CARE_PROVIDER_SITE_OTHER): Payer: Medicare Other | Admitting: *Deleted

## 2014-03-13 DIAGNOSIS — Z23 Encounter for immunization: Secondary | ICD-10-CM

## 2014-05-09 ENCOUNTER — Ambulatory Visit (INDEPENDENT_AMBULATORY_CARE_PROVIDER_SITE_OTHER): Payer: Medicare Other

## 2014-05-09 ENCOUNTER — Encounter: Payer: Self-pay | Admitting: Internal Medicine

## 2014-05-09 ENCOUNTER — Ambulatory Visit (INDEPENDENT_AMBULATORY_CARE_PROVIDER_SITE_OTHER): Payer: Medicare Other | Admitting: Internal Medicine

## 2014-05-09 VITALS — BP 128/73 | HR 88 | Temp 97.9°F | Resp 16 | Ht 64.5 in | Wt 170.0 lb

## 2014-05-09 DIAGNOSIS — M545 Low back pain, unspecified: Secondary | ICD-10-CM

## 2014-05-09 DIAGNOSIS — Z0000 Encounter for general adult medical examination without abnormal findings: Secondary | ICD-10-CM

## 2014-05-09 DIAGNOSIS — I10 Essential (primary) hypertension: Secondary | ICD-10-CM

## 2014-05-09 DIAGNOSIS — J309 Allergic rhinitis, unspecified: Secondary | ICD-10-CM

## 2014-05-09 DIAGNOSIS — Z1211 Encounter for screening for malignant neoplasm of colon: Secondary | ICD-10-CM

## 2014-05-09 DIAGNOSIS — E785 Hyperlipidemia, unspecified: Secondary | ICD-10-CM

## 2014-05-09 DIAGNOSIS — Z23 Encounter for immunization: Secondary | ICD-10-CM

## 2014-05-09 LAB — POCT URINALYSIS DIPSTICK
Bilirubin, UA: NEGATIVE
Blood, UA: NEGATIVE
Glucose, UA: NEGATIVE
Ketones, UA: NEGATIVE
Nitrite, UA: NEGATIVE
PROTEIN UA: NEGATIVE
SPEC GRAV UA: 1.02
Urobilinogen, UA: 0.2
pH, UA: 6.5

## 2014-05-09 LAB — CBC
HCT: 40.9 % (ref 36.0–46.0)
Hemoglobin: 14.4 g/dL (ref 12.0–15.0)
MCH: 31.2 pg (ref 26.0–34.0)
MCHC: 35.2 g/dL (ref 30.0–36.0)
MCV: 88.5 fL (ref 78.0–100.0)
MPV: 9.4 fL (ref 8.6–12.4)
Platelets: 317 10*3/uL (ref 150–400)
RBC: 4.62 MIL/uL (ref 3.87–5.11)
RDW: 13.7 % (ref 11.5–15.5)
WBC: 7.2 10*3/uL (ref 4.0–10.5)

## 2014-05-09 MED ORDER — FENOFIBRATE 160 MG PO TABS: 160.0000 mg | ORAL_TABLET | Freq: Every day | ORAL | Status: DC

## 2014-05-09 MED ORDER — LISINOPRIL-HYDROCHLOROTHIAZIDE 20-12.5 MG PO TABS: 1.0000 | ORAL_TABLET | Freq: Every day | ORAL | Status: DC

## 2014-05-09 NOTE — Progress Notes (Signed)
MRN: [id number]   Subjective:   [name] is a 68 y.o. female presenting for annual physical exam.  Medical care team includes:  PCP: Tonye PearsonOLITTLE, Tamasha Laplante P, MD, last annual physical 04/2013. [facility]: last seen 1 year ago, appointment next week with Dr. London SheerPeter [facility]: last seen 12/2013, Dr. [place]had [facility]yl [facility]: used to be Dr. Lloyd HugerNeil, has not been back in years, never had any significant findings apart from a small benign mass ~20 years ago. Last mammogram was 04/2013. Specialists:   Colonoscopy: last done 2006, had one polyp, on 10 year follow up.  Bone density scan done in 2012, no significant findings.  [name] has Hyperlipidemia; Allergic rhinitis; HTN (hypertension); and BMI 28.0-28.9,adult on her problem list.  Concerns:  Back pain - states several week history of low back pain associated with walking. Patient states that it is sharp and achy in nature, relieved with resting, Tylenol. Denies fevers, weight loss, back pain at night, radiation of pain into legs, urinary of fecal incontinence, sensory changes.   HTN - lis-HCT, takes it daily, does not check BP. Denies hypotension, dizziness, chest pain, heart racing, palpitations, visual disturbance, urinary changes, lower extremity edema. Tries to eat healthily but admits that she has a hard time, could eat better, does Zumba classes at [employer]. Denies smoking, occasional alcohol use.  HL - managed with fenofibrate, denies adverse effects, diet and exercise as above.   Gas - has occasional bloating, increased burping and gas, occurs randomly, not associated with food, no n/v, diarrhea, abdominal pain, sour brash, denies history of heartburn. She does drink milk but recently switched to lactose free milk. Has had similar symptoms in the past, resolved with taking Probiotic, she restarted probiotic in the last week.  Seasonal allergies - admits postnasal drip, occasional cough, wakes up in the morning with congestion  relieved by blowing her nose. Used to take Claritin for this but not taking currently.  Immunizations: Flu vaccine , Td 05/11/2009, Pneumo 23 on 04/12/2013, Shingles 12/09/2009.  [name] has a current medication list which includes the following prescription(s): aspirin, coconut oil, fenofibrate, fish oil-omega-3 fatty acids, lactobacillus, and lisinopril-hydrochlorothiazide.  She has No Known Allergies  [name]  has a past medical history of Hypertension; Hyperlipidemia; and Allergy.  Also  has past surgical history that includes Cesarean section and Tubal ligation.  ROS As in subjective.  Objective:   Vitals: BP 128/73 mmHg  Pulse 88  Temp(Src) 97.9 F (36.6 C)  Resp 16  Ht 5' 4.5" (1.638 m)  Wt 170 lb (77.111 kg)  BMI 28.74 kg/m2  SpO2 96%  BP Readings from Last 3 Encounters:  05/09/14 128/73  01/12/14 132/82  04/12/13 124/70   Wt Readings from Last 3 Encounters:  05/09/14 170 lb (77.111 kg)  01/12/14 173 lb (78.472 kg)  04/12/13 169 lb 3.2 oz (76.749 kg)   Physical Exam  Constitutional: She is oriented to person, place, and time and well-developed, well-nourished, and in no distress.  Eyes: Conjunctivae and EOM are normal. Pupils are equal, round, and reactive to light. Right eye exhibits no discharge. Left eye exhibits no discharge. No scleral icterus.  Cardiovascular: Normal rate, regular rhythm, normal heart sounds and intact distal pulses.  Exam reveals no gallop and no friction rub.   No murmur heard. Pulmonary/Chest: Effort normal and breath sounds normal. No respiratory distress. She has no wheezes. She has no rales. She exhibits no tenderness.  Abdominal: Soft. Bowel sounds are normal.  She exhibits no distension and no mass. There is no tenderness.  Musculoskeletal: Normal range of motion. She exhibits no edema or tenderness.  Neurological: She is alert and oriented to person, place, and time.  Skin: Skin is warm and dry. No rash noted. She is not  diaphoretic. No erythema.  Psychiatric: Mood and affect normal.   [facility] reading (PRIMARY) by  Dr. Merla Richesoolittle and PA-Mani. Lumbar spine: Degenerative changes at level of L4-L5, questionable spondylolisthesis at L5-S1, no lytic lesions or evidence of fracture.   Dg Lumbar Spine 2-3 Views  05/09/2014   CLINICAL DATA:  Back pain  EXAM: LUMBAR SPINE - 2-3 VIEW  COMPARISON:  None.  FINDINGS: The lumbar vertebrae are in normal alignment. Intervertebral disc spaces appear normal. There is very minimal anterolisthesis of L5 on S1 by a 4 mm. There are degenerative changes involving the facet joints particularly of L4-5 and L5-S1. No compression deformity is seen. The SI joints are corticated.  IMPRESSION: 1. 4 mm anterolisthesis of L5 on S1 with degenerative change involving the facet joints of the lower lumbar spine. 2. Normal intervertebral disc spaces.   Electronically Signed   By: Dwyane DeePaul  Barry M.D.   On: 05/09/2014 15:42    Results for orders placed or performed in visit on 05/09/14 (from the past 24 hour(s))  POCT urinalysis dipstick     Status: None   Collection Time: 05/09/14  1:44 PM  Result Value Ref Range   Color, UA Amber    Clarity, UA Clear    Glucose, UA negative    Bilirubin, UA Negative    Ketones, UA Negative    Spec Grav, UA 1.020    Blood, UA Negative    pH, UA 6.5    Protein, UA Negative    Urobilinogen, UA 0.2    Nitrite, UA Negative    Leukocytes, UA Negative    Assessment and Plan :   Discussed healthy lifestyle, diet, exercise, preventative care, vaccinations, and addressed patient's concerns. Plan for follow up in 1 year for repeat annual exam. Otherwise, plan for specific conditions below.  1. Annual physical exam - stable, anticipatory guidance provided - labs pending  2. Midline low back pain without sciatica - likely due to mild degenerative changes as noted on xray, deconditioned core - advised that patient take tylenol as needed for back pain, start yoga, core  strengthening for at least 15 minutes daily - f/u in 6-8 weeks if no improvement in symptoms(PT next) - DG Lumbar Spine 2-3 Views; Future  3. Hyperlipidemia - stable, continue fenofibrate, advised better diet and exercise  4. Essential hypertension - stable, continue lis-HCT, diet and exercise as above  5. Allergic rhinitis, unspecified allergic rhinitis type - Advised to start Flonase, f/u as above  6. Colon cancer screening - Due for screening, referral to Gastroenterology  7. Need for vaccination with 13-polyvalent pneumococcal conjugate vaccine - Pneumococcal conjugate vaccine 13-valent IM  I have participated in the care of this patient with the Advanced Practice Provider and agree with Diagnosis and Plan as documented. Dang Mathison P. Merla Richesoolittle, M.D. Assisted by: Wallis BambergMario Mani, PA-C [facility] [phone] 05/09/2014 1:03 PM

## 2014-05-10 LAB — COMPREHENSIVE METABOLIC PANEL
ALT: 19 U/L (ref 0–35)
AST: 14 U/L (ref 0–37)
Albumin: 4.2 g/dL (ref 3.5–5.2)
Alkaline Phosphatase: 71 U/L (ref 39–117)
BUN: 32 mg/dL — AB (ref 6–23)
CO2: 26 meq/L (ref 19–32)
Calcium: 9.9 mg/dL (ref 8.4–10.5)
Chloride: 104 mEq/L (ref 96–112)
Creat: 0.86 mg/dL (ref 0.50–1.10)
Glucose, Bld: 115 mg/dL — ABNORMAL HIGH (ref 70–99)
Potassium: 4.2 mEq/L (ref 3.5–5.3)
Sodium: 141 mEq/L (ref 135–145)
Total Bilirubin: 0.6 mg/dL (ref 0.2–1.2)
Total Protein: 6.8 g/dL (ref 6.0–8.3)

## 2014-05-10 LAB — LIPID PANEL
CHOL/HDL RATIO: 4.5 ratio
Cholesterol: 149 mg/dL (ref 0–200)
HDL: 33 mg/dL — ABNORMAL LOW (ref >39)
TRIGLYCERIDES: 409 mg/dL — AB (ref <150)

## 2014-05-10 LAB — TSH: TSH: 2.575 u[IU]/mL (ref 0.350–4.500)

## 2014-05-15 ENCOUNTER — Encounter: Payer: Self-pay | Admitting: Internal Medicine

## 2014-05-30 ENCOUNTER — Other Ambulatory Visit: Payer: Self-pay | Admitting: Internal Medicine

## 2014-06-08 ENCOUNTER — Encounter: Payer: Self-pay | Admitting: Internal Medicine

## 2014-09-03 ENCOUNTER — Encounter: Payer: Self-pay | Admitting: *Deleted

## 2014-09-06 ENCOUNTER — Encounter: Payer: Self-pay | Admitting: Internal Medicine

## 2014-09-18 ENCOUNTER — Ambulatory Visit (AMBULATORY_SURGERY_CENTER): Payer: Self-pay

## 2014-09-18 VITALS — Ht 64.0 in | Wt 175.6 lb

## 2014-09-18 DIAGNOSIS — Z1211 Encounter for screening for malignant neoplasm of colon: Secondary | ICD-10-CM

## 2014-09-18 MED ORDER — NA SULFATE-K SULFATE-MG SULF 17.5-3.13-1.6 GM/177ML PO SOLN: ORAL | Status: DC

## 2014-09-18 NOTE — Progress Notes (Signed)
Per pt, no allergies to soy or egg products.Pt not taking any weight loss meds or using  O2 at home. 

## 2014-09-20 ENCOUNTER — Encounter: Payer: Self-pay | Admitting: Internal Medicine

## 2014-10-01 ENCOUNTER — Encounter: Payer: Self-pay | Admitting: Internal Medicine

## 2014-10-03 ENCOUNTER — Encounter: Payer: Self-pay | Admitting: Internal Medicine

## 2014-10-03 ENCOUNTER — Ambulatory Visit (AMBULATORY_SURGERY_CENTER): Payer: Medicare Other | Admitting: Internal Medicine

## 2014-10-03 VITALS — BP 122/65 | HR 74 | Temp 98.6°F | Resp 26 | Ht 64.0 in | Wt 175.0 lb

## 2014-10-03 DIAGNOSIS — Z1211 Encounter for screening for malignant neoplasm of colon: Secondary | ICD-10-CM | POA: Diagnosis present

## 2014-10-03 MED ORDER — SODIUM CHLORIDE 0.9 % IV SOLN: 500.0000 mL | INTRAVENOUS | Status: DC

## 2014-10-03 NOTE — Progress Notes (Signed)
To Pacu. Awake and alert. Pleased with MAC. Report to RN 

## 2014-10-03 NOTE — Patient Instructions (Signed)
YOU HAD AN ENDOSCOPIC PROCEDURE TODAY AT THE [facility]:   Refer to the procedure report that was given to you for any specific questions about what was found during the examination.  If the procedure report does not answer your questions, please call your gastroenterologist to clarify.  If you requested that your care partner not be given the details of your procedure findings, then the procedure report has been included in a sealed envelope for you to review at your convenience later.  YOU SHOULD EXPECT: Some feelings of bloating in the abdomen. Passage of more gas than usual.  Walking can help get rid of the air that was put into your GI tract during the procedure and reduce the bloating. If you had a lower endoscopy (such as a colonoscopy or flexible sigmoidoscopy) you may notice spotting of blood in your stool or on the toilet paper. If you underwent a bowel prep for your procedure, you may not have a normal bowel movement for a few days.  Please Note:  You might notice some irritation and congestion in your nose or some drainage.  This is from the oxygen used during your procedure.  There is no need for concern and it should clear up in a day or so.  SYMPTOMS TO REPORT IMMEDIATELY:   Following lower endoscopy (colonoscopy or flexible sigmoidoscopy):  Excessive amounts of blood in the stool  Significant tenderness or worsening of abdominal pains  Swelling of the abdomen that is new, acute  Fever of 100F or higher  For urgent or emergent issues, a gastroenterologist can be reached at any hour by calling [phone].  DIET: Your first meal following the procedure should be a small meal and then it is ok to progress to your normal diet. Heavy or fried foods are harder to digest and may make you feel nauseous or bloated.  Likewise, meals heavy in dairy and vegetables can increase bloating.  Drink plenty of fluids but you should avoid alcoholic beverages for 24 hours.  ACTIVITY:   You should plan to take it easy for the rest of today and you should NOT DRIVE or use heavy machinery until tomorrow (because of the sedation medicines used during the test).    FOLLOW UP: Our staff will call the number listed on your records the next business day following your procedure to check on you and address any questions or concerns that you may have regarding the information given to you following your procedure. If we do not reach you, we will leave a message.  However, if you are feeling well and you are not experiencing any problems, there is no need to return our call.  We will assume that you have returned to your regular daily activities without incident.  SIGNATURES/CONFIDENTIALITY: You and/or your care partner have signed paperwork which will be entered into your electronic medical record.  These signatures attest to the fact that that the information above on your After Visit Summary has been reviewed and is understood.  Full responsibility of the confidentiality of this discharge information lies with you and/or your care-partner.  Continue your normal medications  Please read over handouts about diverticulosis and high fiber diets  10 year recall

## 2014-10-03 NOTE — Op Note (Signed)
[facility] [address] [place], [id number]   COLONOSCOPY PROCEDURE REPORT  PATIENT: Dede QueryRubio, Alice W  MR#: [id number] BIRTHDATE: 15-Apr-1946 , 69  yrs. old GENDER: female ENDOSCOPIST: Hart Carwinora M Pricsilla Lindvall, MD REFERRED XB:JYNWGNBY:Robert Merla Richesoolittle, M.D. PROCEDURE DATE:  10/03/2014 PROCEDURE:   Colonoscopy, screening First Screening Colonoscopy - Avg.  risk and is 50 yrs.  old or older - No.  Prior Negative Screening - Now for repeat screening. 10 or more years since last screening  History of Adenoma - Now for follow-up colonoscopy & has been > or = to 3 yrs.  N/A  Polyps removed today? No Recommend repeat exam, <10 yrs? No ASA CLASS:   Class II INDICATIONS:Screening for colonic neoplasia and Colorectal Neoplasm Risk Assessment for this procedure is average risk. MEDICATIONS: Monitored anesthesia care and Propofol 250 mg IV  DESCRIPTION OF PROCEDURE:   After the risks benefits and alternatives of the procedure were thoroughly explained, informed consent was obtained.  The digital rectal exam revealed no abnormalities of the rectum.   The LB PFC-H190 [id number]  endoscope was introduced through the anus and advanced to the cecum, which was identified by both the appendix and ileocecal valve. No adverse events experienced.   The quality of the prep was good.  (MoviPrep was used)  The instrument was then slowly withdrawn as the colon was fully examined. Estimated blood loss is zero unless otherwise noted in this procedure report.      COLON FINDINGS: There was moderate diverticulosis noted throughout the entire examined colon with associated muscular hypertrophy, angulation and tortuosity.  Retroflexed views revealed no abnormalities. The time to cecum = 6.04 Withdrawal time = 6.08 The scope was withdrawn and the procedure completed. COMPLICATIONS: There were no immediate complications.  ENDOSCOPIC IMPRESSION: There was moderate diverticulosis noted throughout the  entire examined colon  RECOMMENDATIONS: High fiber diet Recall colonoscopy in 10 years  eSigned:  Hart Carwinora M Jennesis Ramaswamy, MD 10/03/2014 3:32 PM   cc:

## 2014-10-04 ENCOUNTER — Telehealth: Payer: Self-pay | Admitting: *Deleted

## 2014-10-04 NOTE — Telephone Encounter (Signed)
  Follow up Call-  Call back number 10/03/2014  Post procedure Call Back phone  # [phone]  Permission to leave phone message Yes     Patient questions:  Do you have a fever, pain , or abdominal swelling? No. Pain Score  0 *  Have you tolerated food without any problems? Yes.    Have you been able to return to your normal activities? Yes.    Do you have any questions about your discharge instructions: Diet   No. Medications  No. Follow up visit  No.  Do you have questions or concerns about your Care? No.  Actions: * If pain score is 4 or above: No action needed, pain <4.

## 2015-02-09 ENCOUNTER — Encounter (HOSPITAL_COMMUNITY): Payer: Self-pay | Admitting: Emergency Medicine

## 2015-02-09 ENCOUNTER — Emergency Department (HOSPITAL_COMMUNITY)
Admission: EM | Admit: 2015-02-09 | Discharge: 2015-02-09 | Disposition: A | Discharge status: Home / Self Care | Payer: Medicare Other | Attending: Emergency Medicine | Admitting: Emergency Medicine

## 2015-02-09 ENCOUNTER — Ambulatory Visit (INDEPENDENT_AMBULATORY_CARE_PROVIDER_SITE_OTHER): Payer: Medicare Other | Admitting: Family Medicine

## 2015-02-09 ENCOUNTER — Emergency Department (HOSPITAL_COMMUNITY): Payer: Medicare Other

## 2015-02-09 VITALS — BP 142/90 | HR 100 | Temp 98.6°F | Resp 16 | Ht 64.0 in | Wt 181.0 lb

## 2015-02-09 DIAGNOSIS — Z79899 Other long term (current) drug therapy: Secondary | ICD-10-CM | POA: Insufficient documentation

## 2015-02-09 DIAGNOSIS — I10 Essential (primary) hypertension: Secondary | ICD-10-CM | POA: Diagnosis not present

## 2015-02-09 DIAGNOSIS — Z23 Encounter for immunization: Secondary | ICD-10-CM | POA: Diagnosis not present

## 2015-02-09 DIAGNOSIS — Z7982 Long term (current) use of aspirin: Secondary | ICD-10-CM | POA: Diagnosis not present

## 2015-02-09 DIAGNOSIS — R200 Anesthesia of skin: Secondary | ICD-10-CM

## 2015-02-09 DIAGNOSIS — R0789 Other chest pain: Secondary | ICD-10-CM

## 2015-02-09 DIAGNOSIS — Z8639 Personal history of other endocrine, nutritional and metabolic disease: Secondary | ICD-10-CM | POA: Diagnosis not present

## 2015-02-09 DIAGNOSIS — R208 Other disturbances of skin sensation: Secondary | ICD-10-CM

## 2015-02-09 DIAGNOSIS — R202 Paresthesia of skin: Secondary | ICD-10-CM | POA: Diagnosis present

## 2015-02-09 LAB — CBC
HEMATOCRIT: 45.2 % (ref 36.0–46.0)
HEMOGLOBIN: 15.3 g/dL — AB (ref 12.0–15.0)
MCH: 31.7 pg (ref 26.0–34.0)
MCHC: 33.8 g/dL (ref 30.0–36.0)
MCV: 93.8 fL (ref 78.0–100.0)
Platelets: 269 10*3/uL (ref 150–400)
RBC: 4.82 MIL/uL (ref 3.87–5.11)
RDW: 13.3 % (ref 11.5–15.5)
WBC: 8.4 10*3/uL (ref 4.0–10.5)

## 2015-02-09 LAB — COMPREHENSIVE METABOLIC PANEL
ALBUMIN: 4.4 g/dL (ref 3.5–5.0)
ALK PHOS: 72 U/L (ref 38–126)
ALT: 38 U/L (ref 14–54)
ANION GAP: 8 (ref 5–15)
AST: 33 U/L (ref 15–41)
BILIRUBIN TOTAL: 0.7 mg/dL (ref 0.3–1.2)
BUN: 23 mg/dL — AB (ref 6–20)
CALCIUM: 9.9 mg/dL (ref 8.9–10.3)
CO2: 24 mmol/L (ref 22–32)
Chloride: 105 mmol/L (ref 101–111)
Creatinine, Ser: 0.82 mg/dL (ref 0.44–1.00)
GFR calc Af Amer: >60 mL/min (ref >60)
GFR calc non Af Amer: >60 mL/min (ref >60)
GLUCOSE: 133 mg/dL — AB (ref 65–99)
Potassium: 4.3 mmol/L (ref 3.5–5.1)
SODIUM: 137 mmol/L (ref 135–145)
Total Protein: 7.6 g/dL (ref 6.5–8.1)

## 2015-02-09 LAB — PROTIME-INR
INR: 0.9 (ref 0.00–1.49)
Prothrombin Time: 12.4 seconds (ref 11.6–15.2)

## 2015-02-09 LAB — I-STAT TROPONIN, ED: TROPONIN I, POC: 0.01 ng/mL (ref 0.00–0.08)

## 2015-02-09 MED ORDER — SODIUM CHLORIDE 0.9 % IV SOLN
1000.0000 mL | INTRAVENOUS | Status: DC
  Administered 2015-02-09: 1000 mL via INTRAVENOUS

## 2015-02-09 NOTE — ED Provider Notes (Signed)
CSN: [id number]     Arrival date & time 02/09/15  1206 History   First MD Initiated Contact with Patient 02/09/15 1216     Chief Complaint  Patient presents with  . Tingling   History was obtained from the patient as well as the medical record from her doctor's visit today HPI Patient presents to the emergency room for evaluation of chest discomfort associated with left arm tingling. Symptoms started couple weeks ago. Has noticed some intermittent discomfort in the epigastric region and chest. Patient states symptoms will come and go maybe lasting minutes at a time. She has noticed some increased gassiness and burping but does not correlate those particular episodes with the pain. She has not had any trouble with shortness of breath. She denies any trouble with any fevers cough or swelling. Patient does do exercise on occasion, zumba t classes, and has not noticed that activity causes any increased discomfort.  She does notice sometimes it's worse when she is lying flat.  She is also noted some tingling in the left arm. She denies any trouble with numbness or weakness. She denies any trouble with her speech balance or coordination.  Patient was seen by her primary doctor today. Additional evaluation in the emergency room was recommended. Past Medical History  Diagnosis Date  . Hypertension   . Hyperlipidemia   . Allergy    Past Surgical History  Procedure Laterality Date  . Cesarean section      1 time  . Tubal ligation     Family History  Problem Relation Age of Onset  . Diabetes Mother   . Heart disease Father   . Stroke Father    Social History  Substance Use Topics  . Smoking status: Never Smoker   . Smokeless tobacco: Never Used  . Alcohol Use: No   OB History    No data available     Review of Systems  All other systems reviewed and are negative.     Allergies  Review of patient's allergies indicates no known allergies.  Home Medications   Prior to Admission  medications   Medication Sig Start Date End Date Taking? Authorizing Provider  aspirin 81 MG tablet Take 81 mg by mouth daily.   Yes Historical Provider, MD  calcium carbonate (TUMS - DOSED IN MG ELEMENTAL CALCIUM) 500 MG chewable tablet Chew 1 tablet by mouth daily as needed for indigestion or heartburn.   Yes Historical Provider, MD  DiphenhydrAMINE HCl (ALKA-SELTZER PLUS ALLERGY PO) Take by mouth as needed (allergies.).    Yes Historical Provider, MD  fenofibrate 160 MG tablet Take 1 tablet (160 mg total) by mouth daily. 05/09/14  Yes Wallis Bamberg, PA-C  fish oil-omega-3 fatty acids 1000 MG capsule Take 1 g by mouth daily.    Yes Historical Provider, MD  Lactobacillus (PROBIOTIC ACIDOPHILUS PO) Take by mouth.   Yes Historical Provider, MD  lisinopril-hydrochlorothiazide (PRINZIDE,ZESTORETIC) 20-12.5 MG per tablet Take 1 tablet by mouth daily. 05/09/14  Yes Wallis Bamberg, PA-C   BP 95/71 mmHg  Pulse 82  Temp(Src) 97.8 F (36.6 C) (Oral)  Resp 17  SpO2 96% Physical Exam  Constitutional: She is oriented to person, place, and time. She appears well-developed and well-nourished. No distress.  HENT:  Head: Normocephalic and atraumatic.  Right Ear: External ear normal.  Left Ear: External ear normal.  Mouth/Throat: Oropharynx is clear and moist.  Eyes: Conjunctivae are normal. Right eye exhibits no discharge. Left eye exhibits no discharge. No scleral icterus.  Neck: Neck supple. No tracheal deviation present.  Cardiovascular: Normal rate, regular rhythm and intact distal pulses.   Pulmonary/Chest: Effort normal and breath sounds normal. No stridor. No respiratory distress. She has no wheezes. She has no rales.  Abdominal: Soft. Bowel sounds are normal. She exhibits no distension. There is no tenderness. There is no rebound and no guarding.  Musculoskeletal: She exhibits no edema or tenderness.  Neurological: She is alert and oriented to person, place, and time. She has normal strength. No  cranial nerve deficit (No facial droop, extraocular movements intact, tongue midline ) or sensory deficit. She exhibits normal muscle tone. She displays no seizure activity. Coordination normal.  No pronator drift bilateral upper extrem, able to hold both legs off bed for 5 seconds, sensation intact in all extremities, no visual field cuts, no left or right sided neglect, no nystagmus noted   Skin: Skin is warm and dry. No rash noted.  Psychiatric: She has a normal mood and affect.  Nursing note and vitals reviewed.   ED Course  Procedures (including critical care time) Labs Review Labs Reviewed  CBC - Abnormal; Notable for the following:    Hemoglobin 15.3 (*)    All other components within normal limits  COMPREHENSIVE METABOLIC PANEL - Abnormal; Notable for the following:    Glucose, Bld 133 (*)    BUN 23 (*)    All other components within normal limits  PROTIME-INR  I-STAT TROPOININ, ED    Imaging Review Dg Chest 2 View  02/09/2015   CLINICAL DATA:  Chest pain, left arm tingling x1 day, hypertension  EXAM: CHEST - 2 VIEW  COMPARISON:  None available  FINDINGS: Lungs clear.  Borderline cardiomegaly.  No pneumothorax. No effusion. Minimal spurring in the mid thoracic spine.  IMPRESSION: No acute cardiopulmonary disease.   Electronically Signed   By: Corlis Leak M.D.   On: 02/09/2015 13:17   I have personally reviewed and evaluated these images and lab results as part of my medical decision-making.   EKG Interpretation   Date/Time:  Saturday February 09 2015 12:54:07 EDT Ventricular Rate:  83 PR Interval:  175 QRS Duration: 85 QT Interval:  362 QTC Calculation: 425 R Axis:   4 Text Interpretation:  Sinus rhythm Borderline T abnormalities, anterior  leads No old tracing to compare Confirmed by Lyndell Gillyard  MD-J, Hutson Luft ([id number]) on  02/09/2015 1:00:41 PM      MDM   Final diagnoses:  Chest pain, atypical   Sx are atypical for heart disease.  Heart score is three.  Low risk for ACS.   Doubt cardiac etiology.  Normal neuro exam.  Not suggestive of stroke, TIA.  At this time there does not appear to be any evidence of an acute emergency medical condition and the patient appears stable for discharge with appropriate outpatient follow up.  Consider outpatient follow up stress test.    Linwood Dibbles, MD 02/09/15 1527

## 2015-02-09 NOTE — ED Notes (Signed)
Pt reported tingling to Lt arm that started yesterday but has been constant today. (+)PMS, CRT brisk, full ROM, denies injury/trauma, no bruising/deformity/swelling noted. Pt reported epigastic pain with pressure. Pt stated that it was worse after meals. Pt denies diaphoresis, SHOB, n/v and radiation.

## 2015-02-09 NOTE — Discharge Instructions (Signed)

## 2015-02-09 NOTE — Patient Instructions (Signed)
Please proceed to the ER of your choice for further evaluation - go straight there  I am hopeful that all will look ok and that you will be able to go home.   Let us know if you need any help arranging for cardiology follow-up after your ER evaluation

## 2015-02-09 NOTE — ED Notes (Signed)
Awake. Verbally responsive. A/O x4. Resp even and unlabored. No audible adventitious breath sounds noted. ABC's intact. SR on monitor. IV infusing NS at 20ml/hr without difficulty. 

## 2015-02-09 NOTE — ED Notes (Signed)
Patient transported to X-ray 

## 2015-02-09 NOTE — Progress Notes (Signed)
[facility] [address], [place] [address] [phone]  Date:  02/09/2015   Name:  Alice [name]   DOB:  Nov 09, 1945   MRN:  [id number]  PCP:  Tonye Pearson, MD    Chief Complaint: Chest Pain   History of Present Illness:  Alice [name] is a 69 y.o. very pleasant female patient who presents with the following:  Here today with concern of chest pain and left arm pain She has not felt that good over the last couple of weeks- felt like she had indigestion, was burping more than usual.   She has noted some tingling in her left arm yeserday and today.  No weakness She notes tenderenss in the epigastric area and sometimes under the left breast- she will sometimes have pain after a fatty meal but can occur other times as well She tried antiacids- this helps her some No SOB No nausea, no diarrhea but she had had some loose stools and gassiness  She has a history of hyperlipidemia and HTN. She does NOT have DM.   In her family, her father did have a stroke in his 4s and CAD at age 51. No other family history that she knows of  She has never been a smoker She does walk and do zumba some of the time- she has not noted worsening of the pain with exercise but admits she does not do a lot of physical activyt She does not currently have the CP but she does have the persistent tingling in her left arm She has not noted any other neurological sx  Patient Active Problem List   Diagnosis Date Noted  . Hyperlipidemia 03/16/2012  . Allergic rhinitis 03/16/2012  . HTN (hypertension) 03/16/2012  . BMI 28.0-28.9,adult 03/16/2012    Past Medical History  Diagnosis Date  . Hypertension   . Hyperlipidemia   . Allergy     Past Surgical History  Procedure Laterality Date  . Cesarean section      1 time  . Tubal ligation      Social History  Substance Use Topics  . Smoking status: Never Smoker   . Smokeless tobacco: Never Used  . Alcohol Use: No     Family History  Problem Relation Age of Onset  . Diabetes Mother   . Heart disease Father   . Stroke Father     No Known Allergies  Medication list has been reviewed and updated.  Current Outpatient Prescriptions on File Prior to Visit  Medication Sig Dispense Refill  . aspirin 81 MG tablet Take 81 mg by mouth daily.    . DiphenhydrAMINE HCl (ALKA-SELTZER PLUS ALLERGY PO) Take by mouth as needed.    . fenofibrate 160 MG tablet Take 1 tablet (160 mg total) by mouth daily. 90 tablet 3  . fish oil-omega-3 fatty acids 1000 MG capsule Take 1 g by mouth daily.     . Lactobacillus (PROBIOTIC ACIDOPHILUS PO) Take by mouth.    Marland Kitchen lisinopril-hydrochlorothiazide (PRINZIDE,ZESTORETIC) 20-12.5 MG per tablet Take 1 tablet by mouth daily. 90 tablet 3  . Coconut Oil 1000 MG CAPS Take by mouth.     No current facility-administered medications on file prior to visit.    Review of Systems:  As per HPI- otherwise negative.   Physical Examination: Filed Vitals:   02/09/15 1016  BP: 142/90  Pulse: 100  Temp: 98.6 F (37 C)  Resp: 16   Filed Vitals:   02/09/15 1016  Height:  (1.626 m)  Weight: 181 lb (82.101 kg)   Body mass index is 31.05 kg/(m^2). Ideal Body Weight: Weight in (lb) to have BMI = 25: 145.3  GEN: WDWN, NAD, Non-toxic, A & O x 3, obese, looks well HEENT: Atraumatic, Normocephalic. Neck supple. No masses, No LAD. Ears and Nose: No external deformity. CV: RRR, No M/G/R. No JVD. No thrill. No extra heart sounds. PULM: CTA B, no wheezes, crackles, rhonchi. No retractions. No resp. distress. No accessory muscle use. ABD: S, ND, +BS. No rebound. No HSM.   Minimal epigastric tenderness EXTR: No c/c/e NEURO Normal gait.  Normal strength and DTR of all extrmities She continues to notice a feeling of tingling in the left forearm and hand, this is not changed by movement of the arm or neck PSYCH: Normally interactive. Conversant. Not depressed or anxious appearing.  Calm  demeanor.   EKG:  NSR, no ST changes Assessment and Plan: Chest wall pain - Plan: EKG 12-Lead  Needs flu shot - Plan: Flu Vaccine QUAD 36+ mos IM (Fluarix & Fluzone Quad PF  Left arm numbness  Here today with atypical chest pain and left arm tingling. No CP at this time, and her EKG is normal.  Discussed with pt in detail. I suspect that this is a benign issue and likely related to GERD, but cardiac or stroke sx could also present in this way. Would prefer her to go to the ER for further evaluation.  She is willing to do this, declines EMS transport.  She prefers to go to [facility]- she understands that if a more serious cardiac issue is discovered that she will have to be transferred to [facility]   Signed Abbe Amsterdam, MD

## 2015-04-08 ENCOUNTER — Encounter: Payer: Self-pay | Admitting: Internal Medicine

## 2015-04-16 ENCOUNTER — Telehealth: Payer: Self-pay | Admitting: Family Medicine

## 2015-04-16 NOTE — Telephone Encounter (Signed)
LEFT A MESSAGE FOR PATIENT TO RETURN CALL.  SHE HAD HER LAST CPE DONE ON 05/09/14 SO WE NEED TO RESCHEDULE HER CPE FOR AFTER THE NEW YEAR FOR HER MEDICARE TO PAY FOR IT.

## 2015-04-17 ENCOUNTER — Encounter: Payer: Self-pay | Admitting: Internal Medicine

## 2015-05-31 ENCOUNTER — Other Ambulatory Visit: Payer: Self-pay | Admitting: Urgent Care

## 2015-06-07 ENCOUNTER — Ambulatory Visit (INDEPENDENT_AMBULATORY_CARE_PROVIDER_SITE_OTHER): Payer: Medicare Other | Admitting: Internal Medicine

## 2015-06-07 ENCOUNTER — Encounter: Payer: Self-pay | Admitting: Internal Medicine

## 2015-06-07 VITALS — BP 145/77 | HR 92 | Temp 98.2°F | Resp 16 | Ht 63.5 in | Wt 179.0 lb

## 2015-06-07 DIAGNOSIS — Z0000 Encounter for general adult medical examination without abnormal findings: Secondary | ICD-10-CM | POA: Diagnosis not present

## 2015-06-07 DIAGNOSIS — Z6828 Body mass index (BMI) 28.0-28.9, adult: Secondary | ICD-10-CM | POA: Diagnosis not present

## 2015-06-07 DIAGNOSIS — E785 Hyperlipidemia, unspecified: Secondary | ICD-10-CM

## 2015-06-07 DIAGNOSIS — Z1239 Encounter for other screening for malignant neoplasm of breast: Secondary | ICD-10-CM

## 2015-06-07 DIAGNOSIS — I10 Essential (primary) hypertension: Secondary | ICD-10-CM | POA: Diagnosis not present

## 2015-06-07 DIAGNOSIS — J3089 Other allergic rhinitis: Secondary | ICD-10-CM | POA: Diagnosis not present

## 2015-06-07 DIAGNOSIS — E781 Pure hyperglyceridemia: Secondary | ICD-10-CM | POA: Diagnosis not present

## 2015-06-07 DIAGNOSIS — Z6831 Body mass index (BMI) 31.0-31.9, adult: Secondary | ICD-10-CM

## 2015-06-07 DIAGNOSIS — Z1159 Encounter for screening for other viral diseases: Secondary | ICD-10-CM | POA: Diagnosis not present

## 2015-06-07 LAB — CBC WITH DIFFERENTIAL/PLATELET
BASOS ABS: 0 10*3/uL (ref 0.0–0.1)
Basophils Relative: 0 % (ref 0–1)
Eosinophils Absolute: 0.2 10*3/uL (ref 0.0–0.7)
Eosinophils Relative: 3 % (ref 0–5)
HEMATOCRIT: 43.8 % (ref 36.0–46.0)
HEMOGLOBIN: 14.9 g/dL (ref 12.0–15.0)
LYMPHS ABS: 2 10*3/uL (ref 0.7–4.0)
LYMPHS PCT: 26 % (ref 12–46)
MCH: 30.7 pg (ref 26.0–34.0)
MCHC: 34 g/dL (ref 30.0–36.0)
MCV: 90.1 fL (ref 78.0–100.0)
MPV: 9.6 fL (ref 8.6–12.4)
Monocytes Absolute: 0.6 10*3/uL (ref 0.1–1.0)
Monocytes Relative: 8 % (ref 3–12)
NEUTROS ABS: 4.9 10*3/uL (ref 1.7–7.7)
Neutrophils Relative %: 63 % (ref 43–77)
Platelets: 303 10*3/uL (ref 150–400)
RBC: 4.86 MIL/uL (ref 3.87–5.11)
RDW: 13.7 % (ref 11.5–15.5)
WBC: 7.8 10*3/uL (ref 4.0–10.5)

## 2015-06-07 LAB — LIPID PANEL
CHOLESTEROL: 157 mg/dL (ref 125–200)
HDL: 32 mg/dL — AB (ref >=46)
Total CHOL/HDL Ratio: 4.9 Ratio (ref <=5.0)
Triglycerides: 460 mg/dL — ABNORMAL HIGH (ref <150)

## 2015-06-07 LAB — COMPREHENSIVE METABOLIC PANEL
ALBUMIN: 4.1 g/dL (ref 3.6–5.1)
ALK PHOS: 75 U/L (ref 33–130)
ALT: 23 U/L (ref 6–29)
AST: 20 U/L (ref 10–35)
BILIRUBIN TOTAL: 0.7 mg/dL (ref 0.2–1.2)
BUN: 19 mg/dL (ref 7–25)
CALCIUM: 9.6 mg/dL (ref 8.6–10.4)
CO2: 24 mmol/L (ref 20–31)
Chloride: 104 mmol/L (ref 98–110)
Creat: 0.77 mg/dL (ref 0.50–0.99)
GLUCOSE: 96 mg/dL (ref 65–99)
Potassium: 4.2 mmol/L (ref 3.5–5.3)
Sodium: 141 mmol/L (ref 135–146)
TOTAL PROTEIN: 7 g/dL (ref 6.1–8.1)

## 2015-06-07 LAB — POCT GLYCOSYLATED HEMOGLOBIN (HGB A1C): Hemoglobin A1C: 6.8

## 2015-06-07 LAB — TSH: TSH: 1.712 u[IU]/mL (ref 0.350–4.500)

## 2015-06-07 MED ORDER — LISINOPRIL-HYDROCHLOROTHIAZIDE 20-12.5 MG PO TABS: 1.0000 | ORAL_TABLET | Freq: Every day | ORAL | Status: DC

## 2015-06-07 MED ORDER — FENOFIBRATE 160 MG PO TABS: ORAL_TABLET | ORAL | Status: DC

## 2015-06-07 NOTE — Progress Notes (Addendum)
Subjective:    Patient ID: Alice [name], female    DOB: 07-19-1945, 70 y.o.   MRN: [id number] By signing my name below, I, Zola Button, attest that this documentation has been prepared under the direction and in the presence of Tami Lin, MD.  Electronically Signed: Zola Button, Medical Scribe. 06/07/2015. 12:08 PM.  HPI HPI Comments: Alice [name] is a 69 y.o. female who presents to the [facility] for an annual wellness visit.   Patient has still been waking up with nasal drainage in the mornings. She uses Nasonex or Claritin when her symptoms are worse, but she does not want to take it every day. She notes that she has not had much trouble with acid reflux, but she does note having more trouble with burping and gas lately. Her bedroom is upstairs, which is warmer and drier. Her dog no longer sleeps with her. She takes probiotics, but she is unsure if it has made any difference.  Patient had her colonoscopy in May 2016. Her last mammogram was about 3 years ago at [facility]. She is UTD on immunizations.  Patient has back pain at baseline. She has not tried yoga, but does exercise at the [facility] 3 days a week.see hx in chart. Gets along well.  Patient does not check her blood pressure at home. She has not had any problems with her medications. She currently takes OTC fish oil.  Patient Active Problem List   Diagnosis Date Noted  . Hyperlipidemia 03/16/2012  . Allergic rhinitis 03/16/2012  . HTN (hypertension) 03/16/2012  . BMI 28.0-28.9,adult 03/16/2012    Current outpatient prescriptions:  .  aspirin 81 MG tablet, Take 81 mg by mouth daily., Disp: , Rfl:  .  fenofibrate 160 MG tablet, Take 1 tablet by mouth daily, Disp: 90 tablet, Rfl: 3 .  fish oil-omega-3 fatty acids 1000 MG capsule, Take 1 g by mouth daily. , Disp: , Rfl:  .  Lactobacillus (PROBIOTIC ACIDOPHILUS PO), Take by mouth., Disp: , Rfl:  .  lisinopril-hydrochlorothiazide (PRINZIDE,ZESTORETIC)  20-12.5 MG tablet, Take 1 tablet by mouth daily., Disp: 90 tablet, Rfl: 3  Functional status intact Retired and busy!  Review of Systems 14pt ros otherwise neg per form    Objective:   Physical Exam  Constitutional: She is oriented to person, place, and time. She appears well-developed and well-nourished. No distress.  HENT:  Head: Normocephalic and atraumatic.  Right Ear: External ear normal.  Left Ear: External ear normal.  Nose: Nose normal.  Mouth/Throat: Oropharynx is clear and moist. No oropharyngeal exudate.  Eyes: Conjunctivae and EOM are normal. Pupils are equal, round, and reactive to light.  Neck: Normal range of motion. Neck supple. No thyromegaly present.  Cardiovascular: Normal rate, regular rhythm, normal heart sounds and intact distal pulses.   No murmur heard. Pulmonary/Chest: Effort normal and breath sounds normal. No respiratory distress. She has no wheezes.  Clear to auscultation bilaterally.   Abdominal: Soft. Bowel sounds are normal. She exhibits no distension and no mass. There is no tenderness. There is no rebound.  Musculoskeletal: Normal range of motion. She exhibits no edema or tenderness.  Lymphadenopathy:    She has no cervical adenopathy.  Neurological: She is alert and oriented to person, place, and time. She has normal reflexes. No cranial nerve deficit.  Skin: Skin is warm and dry. No rash noted.  Psychiatric: She has a normal mood and affect. Her behavior is normal. Judgment and thought content normal.  Nursing note and vitals reviewed. BP 145/77 mmHg  Pulse 92  Temp(Src) 98.2 F (36.8 C)  Resp 16  Ht 5' 3.5" (1.613 m)  Wt 179 lb (81.194 kg)  BMI 31.21 kg/m2      Assessment & Plan:  I have completed the patient encounter in its entirety as documented by the scribe, with editing by me where necessary. Clorine Swing P. Laney Pastor, M.D.   Medicare annual wellness visit, subsequent  Hypertriglyceridemia/low HDL- Plan: POCT glycosylated hemoglobin  (Hb A1C), Lipid panel, TSH  Other allergic rhinitis--?ROOM HYGIENE NEXT since meds so/so  Essential hypertension - Plan: CBC with Differential/Platelet, Comprehensive metabolic panel  BMI 60.4-54.0--JWJ up to 31 - Plan: POCT glycosylated hemoglobin (Hb A1C), TSH  Need for hepatitis C screening test - Plan: Hepatitis C antibody  mammogr at [facility]  Meds ordered this encounter  Medications  . lisinopril-hydrochlorothiazide (PRINZIDE,ZESTORETIC) 20-12.5 MG tablet    Sig: Take 1 tablet by mouth daily.    Dispense:  90 tablet    Refill:  3  . fenofibrate 160 MG tablet    Sig: Take 1 tablet by mouth daily    Dispense:  90 tablet    Refill:  3   Addendum--labs Results for orders placed or performed in visit on 06/07/15  CBC with Differential/Platelet  Result Value Ref Range   WBC 7.8 4.0 - 10.5 K/uL   RBC 4.86 3.87 - 5.11 MIL/uL   Hemoglobin 14.9 12.0 - 15.0 g/dL   HCT 43.8 36.0 - 46.0 %   MCV 90.1 78.0 - 100.0 fL   MCH 30.7 26.0 - 34.0 pg   MCHC 34.0 30.0 - 36.0 g/dL   RDW 13.7 11.5 - 15.5 %   Platelets 303 150 - 400 K/uL   MPV 9.6 8.6 - 12.4 fL   Neutrophils Relative % 63 43 - 77 %   Neutro Abs 4.9 1.7 - 7.7 K/uL   Lymphocytes Relative 26 12 - 46 %   Lymphs Abs 2.0 0.7 - 4.0 K/uL   Monocytes Relative 8 3 - 12 %   Monocytes Absolute 0.6 0.1 - 1.0 K/uL   Eosinophils Relative 3 0 - 5 %   Eosinophils Absolute 0.2 0.0 - 0.7 K/uL   Basophils Relative 0 0 - 1 %   Basophils Absolute 0.0 0.0 - 0.1 K/uL   Smear Review Criteria for review not met   Comprehensive metabolic panel  Result Value Ref Range   Sodium 141 135 - 146 mmol/L   Potassium 4.2 3.5 - 5.3 mmol/L   Chloride 104 98 - 110 mmol/L   CO2 24 20 - 31 mmol/L   Glucose, Bld 96 65 - 99 mg/dL   BUN 19 7 - 25 mg/dL   Creat 0.77 0.50 - 0.99 mg/dL   Total Bilirubin 0.7 0.2 - 1.2 mg/dL   Alkaline Phosphatase 75 33 - 130 U/L   AST 20 10 - 35 U/L   ALT 23 6 - 29 U/L   Total Protein 7.0 6.1 - 8.1 g/dL   Albumin 4.1 3.6 -  5.1 g/dL   Calcium 9.6 8.6 - 10.4 mg/dL  Lipid panel  Result Value Ref Range   Cholesterol 157 125 - 200 mg/dL   Triglycerides 460 (H) <150 mg/dL   HDL 32 (L) >=46 mg/dL   Total CHOL/HDL Ratio 4.9 <=5.0 Ratio   VLDL NOT CALC <30 mg/dL   LDL Cholesterol NOT CALC <130 mg/dL  TSH  Result Value Ref Range   TSH 1.712 0.350 -  4.500 uIU/mL  Hepatitis C antibody  Result Value Ref Range   HCV Ab NEGATIVE NEGATIVE  POCT glycosylated hemoglobin (Hb A1C)  Result Value Ref Range   Hemoglobin A1C 6.8   will start aggressive diet//TG control Risk index suggests she would benefit from a statin Ok to stop fenofibrate and continue fish oil FD/u Appt C Jeffery 6 mo HO from UTD re TG

## 2015-06-08 LAB — HEPATITIS C ANTIBODY: HCV Ab: NEGATIVE

## 2015-06-08 MED ORDER — ATORVASTATIN CALCIUM 20 MG PO TABS: 20.0000 mg | ORAL_TABLET | Freq: Every day | ORAL | Status: DC

## 2015-06-14 ENCOUNTER — Telehealth: Payer: Self-pay | Admitting: Family Medicine

## 2015-06-14 NOTE — Telephone Encounter (Signed)
lmom for pt to see Chelle for a 6 month f/u of lipids and A1c  12/17/15 :30 Doolittle sent this message to me

## 2015-06-19 LAB — HM MAMMOGRAPHY

## 2015-07-02 ENCOUNTER — Encounter: Payer: Self-pay | Admitting: Family Medicine

## 2015-09-17 ENCOUNTER — Encounter: Payer: Self-pay | Admitting: Internal Medicine

## 2015-12-17 ENCOUNTER — Ambulatory Visit: Payer: Medicare Other | Admitting: Physician Assistant

## 2016-01-07 ENCOUNTER — Ambulatory Visit (INDEPENDENT_AMBULATORY_CARE_PROVIDER_SITE_OTHER): Payer: Medicare Other | Admitting: Physician Assistant

## 2016-01-07 ENCOUNTER — Encounter: Payer: Self-pay | Admitting: Physician Assistant

## 2016-01-07 ENCOUNTER — Telehealth: Payer: Self-pay

## 2016-01-07 VITALS — BP 136/82 | HR 95 | Temp 97.7°F | Resp 18 | Ht 63.5 in | Wt 177.0 lb

## 2016-01-07 DIAGNOSIS — R739 Hyperglycemia, unspecified: Secondary | ICD-10-CM

## 2016-01-07 DIAGNOSIS — Z23 Encounter for immunization: Secondary | ICD-10-CM

## 2016-01-07 DIAGNOSIS — E781 Pure hyperglyceridemia: Secondary | ICD-10-CM | POA: Diagnosis not present

## 2016-01-07 DIAGNOSIS — I10 Essential (primary) hypertension: Secondary | ICD-10-CM

## 2016-01-07 LAB — COMPREHENSIVE METABOLIC PANEL
ALBUMIN: 4.2 g/dL (ref 3.6–5.1)
ALT: 41 U/L — ABNORMAL HIGH (ref 6–29)
AST: 33 U/L (ref 10–35)
Alkaline Phosphatase: 136 U/L — ABNORMAL HIGH (ref 33–130)
BUN: 20 mg/dL (ref 7–25)
CHLORIDE: 103 mmol/L (ref 98–110)
CO2: 24 mmol/L (ref 20–31)
Calcium: 9.8 mg/dL (ref 8.6–10.4)
Creat: 0.68 mg/dL (ref 0.60–0.93)
Glucose, Bld: 165 mg/dL — ABNORMAL HIGH (ref 65–99)
POTASSIUM: 4.1 mmol/L (ref 3.5–5.3)
SODIUM: 138 mmol/L (ref 135–146)
TOTAL PROTEIN: 7 g/dL (ref 6.1–8.1)
Total Bilirubin: 0.9 mg/dL (ref 0.2–1.2)

## 2016-01-07 LAB — CBC WITH DIFFERENTIAL/PLATELET
BASOS ABS: 0 {cells}/uL (ref 0–200)
Basophils Relative: 0 %
EOS PCT: 2 %
Eosinophils Absolute: 178 cells/uL (ref 15–500)
HCT: 43.5 % (ref 35.0–45.0)
HEMOGLOBIN: 14.9 g/dL (ref 11.7–15.5)
Lymphocytes Relative: 27 %
Lymphs Abs: 2403 cells/uL (ref 850–3900)
MCH: 31.1 pg (ref 27.0–33.0)
MCHC: 34.3 g/dL (ref 32.0–36.0)
MCV: 90.8 fL (ref 80.0–100.0)
MONOS PCT: 6 %
MPV: 10 fL (ref 7.5–12.5)
Monocytes Absolute: 534 cells/uL (ref 200–950)
NEUTROS PCT: 65 %
Neutro Abs: 5785 cells/uL (ref 1500–7800)
Platelets: 246 10*3/uL (ref 140–400)
RBC: 4.79 MIL/uL (ref 3.80–5.10)
RDW: 13.5 % (ref 11.0–15.0)
WBC: 8.9 10*3/uL (ref 3.8–10.8)

## 2016-01-07 LAB — LIPID PANEL
CHOLESTEROL: 120 mg/dL — AB (ref 125–200)
HDL: 40 mg/dL — ABNORMAL LOW (ref >=46)
LDL Cholesterol: 20 mg/dL (ref <130)
Total CHOL/HDL Ratio: 3 Ratio (ref <=5.0)
Triglycerides: 300 mg/dL — ABNORMAL HIGH (ref <150)
VLDL: 60 mg/dL — AB (ref <30)

## 2016-01-07 NOTE — Patient Instructions (Signed)
     IF you received an x-ray today, you will receive an invoice from [facility]. Please contact [facility] at [phone] with questions or concerns regarding your invoice.   IF you received labwork today, you will receive an invoice from [facility]. Please contact [facility] at [phone] with questions or concerns regarding your invoice.   Our billing staff will not be able to assist you with questions regarding bills from these companies.  You will be contacted with the lab results as soon as they are available. The fastest way to get your results is to activate your My Chart account. Instructions are located on the last page of this paperwork. If you have not heard from us regarding the results in 2 weeks, please contact this office.      

## 2016-01-07 NOTE — Progress Notes (Signed)
Patient ID: Alice [name], female    DOB: 10/03/1945, 70 y.o.   MRN: [id number]  PCP: Alice PearsonOLITTLE, ROBERT P, MD  Subjective:   Chief Complaint  Patient presents with  . Follow-up    labs    HPI Presents for evaluation of hyperlipidemia and HTN, and to establish with new PCP since Dr. Netta Corriganoolittle's retirement.  Feels well in general. Tolerating her medications without difficulty..  Is not fasting today: Coffee with a little bit of cream.   Review of Systems  Constitutional: Negative.   HENT: Negative for sore throat.        RIGHT ear itching.  Eyes: Negative for visual disturbance.  Respiratory: Negative for cough, chest tightness, shortness of breath and wheezing.   Cardiovascular: Negative for chest pain and palpitations.  Gastrointestinal: Positive for abdominal distention and diarrhea. Negative for abdominal pain, nausea and vomiting.       Intermittent gas and diarrhea several months ago, then stopped. 2 weeks ago, had some upper abdominal pain x 2 days. Took a dose of Alka Seltzer and vomited, with complete resolution of the symptoms. No recurrence.  Genitourinary: Negative for dysuria, frequency, hematuria and urgency.  Musculoskeletal: Negative for arthralgias and myalgias.  Skin: Negative for rash.  Neurological: Negative for dizziness, weakness and headaches.  Psychiatric/Behavioral: Negative for decreased concentration. The patient is not nervous/anxious.        Patient Active Problem List   Diagnosis Date Noted  . Hypertriglyceridemia 03/16/2012  . Allergic rhinitis 03/16/2012  . HTN (hypertension) 03/16/2012  . BMI 31.0-31.9,adult 03/16/2012     Prior to Admission medications   Medication Sig Start Date End Date Taking? Authorizing Provider  aspirin 81 MG tablet Take 81 mg by mouth daily.   Yes Historical Provider, MD  atorvastatin (LIPITOR) 20 MG tablet Take 1 tablet (20 mg total) by mouth daily. 06/08/15  Yes Alice Pearsonobert [name] Doolittle, MD  fish oil-omega-3  fatty acids 1000 MG capsule Take 1 g by mouth daily.    Yes Historical Provider, MD  Lactobacillus (PROBIOTIC ACIDOPHILUS PO) Take by mouth.   Yes Historical Provider, MD  lisinopril-hydrochlorothiazide (PRINZIDE,ZESTORETIC) 20-12.5 MG tablet Take 1 tablet by mouth daily. 06/07/15  Yes Alice Pearsonobert [name] Doolittle, MD     No Known Allergies     Objective:  Physical Exam  Constitutional: She is oriented to person, place, and time. She appears well-developed and well-nourished. She is active and cooperative. No distress.  BP 136/82 (BP Location: Right Arm, Patient Position: Sitting, Cuff Size: Small)   Pulse 95   Temp 97.7 F (36.5 C) (Oral)   Resp 18   Ht 5' 3.5" (1.613 m)   Wt 177 lb (80.3 kg)   SpO2 98%   BMI 30.86 kg/m   HENT:  Head: Normocephalic and atraumatic.  Right Ear: Hearing, tympanic membrane, external ear and ear canal normal.  Left Ear: Hearing, tympanic membrane, external ear and ear canal normal.  Eyes: Conjunctivae are normal. No scleral icterus.  Neck: Normal range of motion. Neck supple. No thyromegaly present.  Cardiovascular: Normal rate, regular rhythm and normal heart sounds.   Pulses:      Radial pulses are 2+ on the right side, and 2+ on the left side.  Pulmonary/Chest: Effort normal and breath sounds normal.  Lymphadenopathy:       Head (right side): No tonsillar, no preauricular, no posterior auricular and no occipital adenopathy present.       Head (left side): No tonsillar, no preauricular, no posterior auricular  and no occipital adenopathy present.    She has no cervical adenopathy.       Right: No supraclavicular adenopathy present.       Left: No supraclavicular adenopathy present.  Neurological: She is alert and oriented to person, place, and time. No sensory deficit.  Skin: Skin is warm, dry and intact. No rash noted. No cyanosis or erythema. Nails show no clubbing.  Psychiatric: She has a normal mood and affect. Her speech is normal and behavior is  normal.           Assessment & Plan:   1. Essential hypertension Controlled. Stable. Continue current treatment. - Comprehensive metabolic panel - CBC with Differential/Platelet  2. Hypertriglyceridemia Await labs. Adjust regimen as indicated by results. - Comprehensive metabolic panel - Lipid panel  3. Need for influenza vaccination - Flu Vaccine QUAD 36+ mos IM     Fernande Bras, PA-C Physician Assistant-Certified [facility]

## 2016-01-07 NOTE — Telephone Encounter (Signed)
Patient requests his labs be called to his wife.  He does not have cell reception.

## 2016-01-08 NOTE — Telephone Encounter (Signed)
Please review and sent result note or lnote to the lab pool.

## 2016-01-09 NOTE — Addendum Note (Signed)
Addended by: Fernande BrasJEFFERY, Quashawn Jewkes S on: 01/09/2016 09:01 PM   Modules accepted: Orders

## 2016-01-15 NOTE — Telephone Encounter (Signed)
I was waiting on the results of the add-on tests, which I have just found out have not been added. The labs are essentially normal, except that the glucose and triglycerides are elevated, which is concerning for diabetes. We need to do additional testing to assess that, and possibly start treatment.  Results for orders placed or performed in visit on 01/07/16  Comprehensive metabolic panel  Result Value Ref Range   Sodium 138 135 - 146 mmol/L   Potassium 4.1 3.5 - 5.3 mmol/L   Chloride 103 98 - 110 mmol/L   CO2 24 20 - 31 mmol/L   Glucose, Bld 165 (H) 65 - 99 mg/dL   BUN 20 7 - 25 mg/dL   Creat 0.68 0.60 - 0.93 mg/dL   Total Bilirubin 0.9 0.2 - 1.2 mg/dL   Alkaline Phosphatase 136 (H) 33 - 130 U/L   AST 33 10 - 35 U/L   ALT 41 (H) 6 - 29 U/L   Total Protein 7.0 6.1 - 8.1 g/dL   Albumin 4.2 3.6 - 5.1 g/dL   Calcium 9.8 8.6 - 10.4 mg/dL  CBC with Differential/Platelet  Result Value Ref Range   WBC 8.9 3.8 - 10.8 K/uL   RBC 4.79 3.80 - 5.10 MIL/uL   Hemoglobin 14.9 11.7 - 15.5 g/dL   HCT 43.5 35.0 - 45.0 %   MCV 90.8 80.0 - 100.0 fL   MCH 31.1 27.0 - 33.0 pg   MCHC 34.3 32.0 - 36.0 g/dL   RDW 13.5 11.0 - 15.0 %   Platelets 246 140 - 400 K/uL   MPV 10.0 7.5 - 12.5 fL   Neutro Abs 5,785 1,500 - 7,800 cells/uL   Lymphs Abs 2,403 850 - 3,900 cells/uL   Monocytes Absolute 534 200 - 950 cells/uL   Eosinophils Absolute 178 15 - 500 cells/uL   Basophils Absolute 0 0 - 200 cells/uL   Neutrophils Relative % 65 %   Lymphocytes Relative 27 %   Monocytes Relative 6 %   Eosinophils Relative 2 %   Basophils Relative 0 %   Smear Review Criteria for review not met   Lipid panel  Result Value Ref Range   Cholesterol 120 (L) 125 - 200 mg/dL   Triglycerides 300 (H) <150 mg/dL   HDL 40 (L) >=46 mg/dL   Total CHOL/HDL Ratio 3.0 <=5.0 Ratio   VLDL 60 (H) <30 mg/dL   LDL Cholesterol 20 <130 mg/dL

## 2016-01-17 NOTE — Telephone Encounter (Signed)
Pt advised.

## 2016-01-21 ENCOUNTER — Ambulatory Visit (INDEPENDENT_AMBULATORY_CARE_PROVIDER_SITE_OTHER): Payer: Medicare Other | Admitting: Physician Assistant

## 2016-01-21 DIAGNOSIS — R739 Hyperglycemia, unspecified: Secondary | ICD-10-CM

## 2016-01-21 LAB — HEMOGLOBIN A1C
HEMOGLOBIN A1C: 8.1 % — AB (ref <5.7)
MEAN PLASMA GLUCOSE: 186 mg/dL

## 2016-01-21 LAB — GLUCOSE, POCT (MANUAL RESULT ENTRY): POC Glucose: 209 mg/dl — AB (ref 70–99)

## 2016-01-22 ENCOUNTER — Encounter: Payer: Self-pay | Admitting: Physician Assistant

## 2016-01-22 DIAGNOSIS — E119 Type 2 diabetes mellitus without complications: Secondary | ICD-10-CM | POA: Insufficient documentation

## 2016-01-22 NOTE — Progress Notes (Signed)
Lab only visit 

## 2016-02-04 ENCOUNTER — Encounter: Payer: Self-pay | Admitting: Physician Assistant

## 2016-02-04 ENCOUNTER — Ambulatory Visit (INDEPENDENT_AMBULATORY_CARE_PROVIDER_SITE_OTHER): Payer: Medicare Other | Admitting: Physician Assistant

## 2016-02-04 VITALS — BP 128/84 | HR 94 | Temp 98.8°F | Resp 16 | Wt 176.0 lb

## 2016-02-04 DIAGNOSIS — R197 Diarrhea, unspecified: Secondary | ICD-10-CM

## 2016-02-04 DIAGNOSIS — E119 Type 2 diabetes mellitus without complications: Secondary | ICD-10-CM

## 2016-02-04 MED ORDER — METFORMIN HCL 500 MG PO TABS
500.0000 mg | ORAL_TABLET | Freq: Two times a day (BID) | ORAL | 3 refills | Status: DC
Start: 2016-02-04 — End: 2017-01-21

## 2016-02-04 NOTE — Progress Notes (Signed)
Chief Complaint  Patient presents with  . Follow-up    Diabetes from lab work    History of Present Illness: Patient presents for discussion of treatment for new diagnosis of diabetes type 2.  Elevated glucose was noted at her first visit with me, to establish care, as her previous PCP has retired. Repeat fasting glucose was 209 and A!C was 8.1%.  She already takes a daily ASA and statin, and already takes an ACE-I.   No Known Allergies  Prior to Admission medications   Medication Sig Start Date End Date Taking? Authorizing Provider  aspirin 81 MG tablet Take 81 mg by mouth daily.   Yes Historical Provider, MD  atorvastatin (LIPITOR) 20 MG tablet Take 1 tablet (20 mg total) by mouth daily. 06/08/15  Yes Tonye Pearsonobert P Doolittle, MD  fish oil-omega-3 fatty acids 1000 MG capsule Take 1 g by mouth daily.    Yes Historical Provider, MD  Lactobacillus (PROBIOTIC ACIDOPHILUS PO) Take by mouth.   Yes Historical Provider, MD  lisinopril-hydrochlorothiazide (PRINZIDE,ZESTORETIC) 20-12.5 MG tablet Take 1 tablet by mouth daily. 06/07/15  Yes Tonye Pearsonobert P Doolittle, MD     Patient Active Problem List   Diagnosis Date Noted  . Diabetes mellitus type 2, uncomplicated (HCC) 01/22/2016  . Hypertriglyceridemia 03/16/2012  . Allergic rhinitis 03/16/2012  . HTN (hypertension) 03/16/2012  . BMI 31.0-31.9,adult 03/16/2012     Physical Exam  Constitutional: She is oriented to person, place, and time. She appears well-developed and well-nourished. She is active and cooperative. No distress.  BP 128/84 (BP Location: Right Arm, Patient Position: Sitting, Cuff Size: Large)   Pulse 94   Temp 98.8 F (37.1 C)   Resp 16   Wt 176 lb (79.8 kg)   SpO2 95%   BMI 30.69 kg/m    Eyes: Conjunctivae are normal.  Pulmonary/Chest: Effort normal.  Neurological: She is alert and oriented to person, place, and time.  Psychiatric: She has a normal mood and affect. Her speech is normal and behavior is normal.       ASSESSMENT & PLAN:  1. Type 2 diabetes mellitus without complication, without long-term current use of insulin (HCC) Start metformin, QD x 1 week, then BID. Refer for diabetes education. - metFORMIN (GLUCOPHAGE) 500 MG tablet; Take 1 tablet (500 mg total) by mouth 2 (two) times daily with a meal.  Dispense: 180 tablet; Refill: 3 - Ambulatory referral to diabetic education    Return in about 3 months (around 05/05/2016).    Fernande Brashelle S. Meggie Laseter, PA-C Physician Assistant-Certified [facility]

## 2016-02-04 NOTE — Progress Notes (Signed)
   Subjective:    Patient ID: Alice [name], female    DOB: 03/13/1946, 70 y.o.   MRN: [id number] Chief Complaint  Patient presents with  . Follow-up    Diabetes from lab work    HPI Patients presents today for evaluation of newly diagnosed T2DM. She was just recently seen by at [facility] on 01/21/2016 in regards to hyperglycemia. In that visit, the patient underwent fasting glucose and A1C testing. She is here today to discuss the results and treatment plan for her newly diagnosed diabetes.  Today in the office the patient reports she is tolerating her current medications well. She questions if the Crestor is the reason for her high glucose and A1C numbers. She also reports [facility] x week for zumba, but otherwise likes to read cross stitch.  Denies HA, blurry vision, urinary frequency, urgency, and numbness and tingling.   Review of Systems All review of systems are negative except those stated above.     Objective:   Physical Exam  Constitutional: She appears well-developed and well-nourished. No distress.  HENT:  Head: Normocephalic and atraumatic.  Right Ear: External ear normal.  Left Ear: External ear normal.  Mouth/Throat: Oropharynx is clear and moist. No oropharyngeal exudate.  Eyes: Conjunctivae are normal. Pupils are equal, round, and reactive to light. Right eye exhibits no discharge. Left eye exhibits no discharge.  Neck: Neck supple. No thyromegaly present.  Cardiovascular: Normal rate, regular rhythm, normal heart sounds and intact distal pulses.  Exam reveals no gallop and no friction rub.   No murmur heard. Pulmonary/Chest: Breath sounds normal. No respiratory distress. She has no wheezes.  Abdominal: Soft. Bowel sounds are normal. There is tenderness (Epigastric pain with palpation).  Lymphadenopathy:    She has no cervical adenopathy.  Neurological: She is alert. She displays normal reflexes.  Skin: Skin is warm and dry. No rash noted. She is  not diaphoretic.  Psychiatric: She has a normal mood and affect. Her behavior is normal.       Assessment & Plan:  1. Type 2 diabetes mellitus without complication, without long-term current use of insulin (HCC) Newly diagnosed T2DM; will start her on Glucophage and follow-up in 3 months  - metFORMIN (GLUCOPHAGE) 500 MG tablet; Take 1 tablet (500 mg total) by mouth 2 (two) times daily with a meal.  Dispense: 180 tablet; Refill: 3 - Ambulatory referral to diabetic education  2. Diarrhea, unspecified type Most likely due to medication as it started around the same time she started Crestor, will wait to see if symptoms resolve

## 2016-02-04 NOTE — Patient Instructions (Addendum)
Limit the sugars/sweets and starches in your diet. Drink plenty of water!    IF you received an x-ray today, you will receive an invoice from [facility]. Please contact [facility] at [phone] with questions or concerns regarding your invoice.   IF you received labwork today, you will receive an invoice from [facility]. Please contact [facility] at [phone] with questions or concerns regarding your invoice.   Our billing staff will not be able to assist you with questions regarding bills from these companies.  You will be contacted with the lab results as soon as they are available. The fastest way to get your results is to activate your My Chart account. Instructions are located on the last page of this paperwork. If you have not heard from us regarding the results in 2 weeks, please contact this office.

## 2016-02-11 ENCOUNTER — Ambulatory Visit: Payer: Medicare Other | Admitting: Physician Assistant

## 2016-02-18 ENCOUNTER — Encounter: Payer: Medicare Other | Attending: Physician Assistant | Admitting: Skilled Nursing Facility1

## 2016-02-18 ENCOUNTER — Encounter: Payer: Self-pay | Admitting: Skilled Nursing Facility1

## 2016-02-18 DIAGNOSIS — Z713 Dietary counseling and surveillance: Secondary | ICD-10-CM | POA: Diagnosis not present

## 2016-02-18 DIAGNOSIS — E119 Type 2 diabetes mellitus without complications: Secondary | ICD-10-CM | POA: Diagnosis present

## 2016-02-18 DIAGNOSIS — Z6830 Body mass index (BMI) 30.0-30.9, adult: Secondary | ICD-10-CM | POA: Insufficient documentation

## 2016-02-18 NOTE — Progress Notes (Signed)
Patient was seen on 02/18/2016 for the first of a series of three diabetes self-management courses at [facility].  Patient Education Plan per assessed needs and concerns is to attend four course education program for Diabetes Self Management Education.  The following learning objectives were met by the patient during this class:  Describe diabetes  State some common risk factors for diabetes  Defines the role of glucose and insulin  Identifies type of diabetes and pathophysiology  Describe the relationship between diabetes and cardiovascular risk  State the members of the Healthcare Team  States the rationale for glucose monitoring  State when to test glucose  State their individual Target Range  State the importance of logging glucose readings  Describe how to interpret glucose readings  Identifies A1C target  Explain the correlation between A1c and eAG values  State symptoms and treatment of high blood glucose  State symptoms and treatment of low blood glucose  Explain proper technique for glucose testing  Identifies proper sharps disposal  Handouts given during class include:  Living Well with Diabetes book  Carb Counting and Meal Planning book  Meal Plan Card  Carbohydrate guide  Meal planning worksheet  Low Sodium Flavoring Tips  The diabetes portion plate  F5O to eAG Conversion Chart  Diabetes Medications  Diabetes Recommended Care Schedule  Support Group  Diabetes Success Plan  Core Class Satisfaction Survey  Follow-Up Plan:  Attend core 2

## 2016-02-25 ENCOUNTER — Encounter: Payer: Medicare Other | Admitting: Dietician

## 2016-02-25 DIAGNOSIS — E119 Type 2 diabetes mellitus without complications: Secondary | ICD-10-CM | POA: Diagnosis not present

## 2016-02-25 NOTE — Progress Notes (Signed)

## 2016-03-03 ENCOUNTER — Ambulatory Visit: Payer: Medicare Other

## 2016-03-05 DIAGNOSIS — E119 Type 2 diabetes mellitus without complications: Secondary | ICD-10-CM

## 2016-03-05 NOTE — Progress Notes (Signed)
Patient was seen on 03/05/16 for the third of a series of three diabetes self-management courses at the [facility].   Alice [name]. State the amount of activity recommended for healthy living . Describe activities suitable for individual needs . Identify ways to regularly incorporate activity into daily life . Identify barriers to activity and ways to over come these barriers  Identify diabetes medications being personally used and their primary action for lowering glucose and possible side effects . Describe role of stress on blood glucose and develop strategies to address psychosocial issues . Identify diabetes complications and ways to prevent them  Explain how to manage diabetes during illness . Evaluate success in meeting personal goal . Establish 2-3 goals that they will plan to diligently work on until they return for the  7380-month follow-up visit  Goals:   I will count my carb choices at most meals and snacks  Eat healthier foods like vegetables  I will be active 30 minutes or more 5-6 times a week  I will take my diabetes medications as scheduled  Your patient has identified these potential barriers to change:  Motivation  Plan:  Attend Support Group as desired

## 2016-05-26 ENCOUNTER — Encounter: Payer: Self-pay | Admitting: Physician Assistant

## 2016-05-26 ENCOUNTER — Ambulatory Visit (INDEPENDENT_AMBULATORY_CARE_PROVIDER_SITE_OTHER): Payer: Medicare Other | Admitting: Physician Assistant

## 2016-05-26 VITALS — BP 126/67 | HR 78 | Temp 98.4°F | Resp 16 | Ht 64.0 in | Wt 163.0 lb

## 2016-05-26 DIAGNOSIS — E119 Type 2 diabetes mellitus without complications: Secondary | ICD-10-CM

## 2016-05-26 DIAGNOSIS — E781 Pure hyperglyceridemia: Secondary | ICD-10-CM | POA: Diagnosis not present

## 2016-05-26 DIAGNOSIS — I10 Essential (primary) hypertension: Secondary | ICD-10-CM | POA: Diagnosis not present

## 2016-05-26 MED ORDER — ATORVASTATIN CALCIUM 20 MG PO TABS: 20.0000 mg | ORAL_TABLET | Freq: Every day | ORAL | 3 refills | Status: DC

## 2016-05-26 MED ORDER — LISINOPRIL-HYDROCHLOROTHIAZIDE 20-12.5 MG PO TABS: 1.0000 | ORAL_TABLET | Freq: Every day | ORAL | 3 refills | Status: DC

## 2016-05-26 NOTE — Progress Notes (Signed)
     Patient ID: Alice [name], female    DOB: 04/24/1946, 71 y.o.   MRN: [id number]  PCP: Porfirio Oarhelle Jeffery, PA-C  Chief Complaint  Patient presents with  . Follow-up    MEDICATION and DIABETES    Subjective:   Presents for follow up of her type 2 diabetes mellitus.  Pt is a 71yo caucasian female who presents for follow up of her diabetes. She was diagnosed 4 months ago. She states that she is doing well. She has attended three diabetes education classes and especially enjoyed the dietary education portion of the curriculum. She has continued to exercise and attend Z[facility] classes at [facility]. She has lost 14 pounds since her visit in 02/2016 (177 lbs to 163 lbs) with effort. Pt is tolerating Metformin well except for one episode of vomiting after the first dose, but none since then. Denies fever, chills, fatigue, changes in vision, cough, SOB, chest pain, palpitations, abdominal pain, nausea, myalgias, or peripheral neuropathy. She has no complaints or concerns at this time.    Review of Systems In addition to that stated in the HPI above:  Abd: Denies diarrhea or constipation. GU: Denies dysuria or vaginal itching.  Patient Active Problem List   Diagnosis Date Noted  . Diabetes mellitus type 2, uncomplicated (HCC) 01/22/2016  . Hypertriglyceridemia 03/16/2012  . Allergic rhinitis 03/16/2012  . HTN (hypertension) 03/16/2012  . BMI 31.0-31.9,adult 03/16/2012     Prior to Admission medications   Medication Sig Start Date End Date Taking? Authorizing Provider  aspirin 81 MG tablet Take 81 mg by mouth daily.   Yes Historical Provider, MD  atorvastatin (LIPITOR) 20 MG tablet Take 1 tablet (20 mg total) by mouth daily. 06/08/15  Yes Tonye Pearsonobert P Doolittle, MD  fish oil-omega-3 fatty acids 1000 MG capsule Take 1 g by mouth daily.    Yes Historical Provider, MD  Lactobacillus (PROBIOTIC ACIDOPHILUS PO) Take by mouth.   Yes Historical Provider, MD  lisinopril-hydrochlorothiazide  (PRINZIDE,ZESTORETIC) 20-12.5 MG tablet Take 1 tablet by mouth daily. 06/07/15  Yes Tonye Pearsonobert P Doolittle, MD  metFORMIN (GLUCOPHAGE) 500 MG tablet Take 1 tablet (500 mg total) by mouth 2 (two) times daily with a meal. 02/04/16  Yes Chelle Jeffery, PA-C     No Known Allergies     Objective:  Physical Exam HEENT: PERRLA Pulm: Good respiratory effort. CTAB. No wheezes, rales, or rhonchi. CV: RRR. No M/R/G. Abd: Nontender. + BS x 4 quadrants.     Assessment & Plan:   1. Type 2 diabetes mellitus without complication, without long-term current use of insulin (HCC) Pt advised to continue metformin as directed, pending A1C, along with continuing diet and exercise. Follow up in 3 months for re-evaluation of diabetes.  - Comprehensive metabolic panel - Hemoglobin A1c - HM DIABETES FOOT EXAM - HM DIABETES EYE EXAM  2. Essential hypertension Pt advised to continue medications as directed. - CBC with Differential/Platelet - Comprehensive metabolic panel  3. Hypertriglyceridemia Pt advised to continue statin as directed. - Comprehensive metabolic panel - Lipid panel   Georgiana SpinnerHannah Bradley Elmire Amrein, PA-S

## 2016-05-26 NOTE — Progress Notes (Signed)
Patient ID: Alice [name], female    DOB: 02/21/1946, 71 y.o.   MRN: [id number]  PCP: Porfirio Oarhelle [address], PA-C  Chief Complaint  Patient presents with  . Follow-up    MEDICATION and DIABETES    Subjective:   Presents for evaluation of diabetes. She needs refills of lisinoprilHCTZ and atorvastatin.  This was a new diagnosis last fall. She has attended the series of 3 education classes. Is pleased with weight loss she has experienced with the changes she has made in her eating[facility] Tolerating metformin well (had vomiting after the first dose, none since). Also continues to tolerate BP and cholesterol medications.  No CP, SOB, HA, dizziness. No urinary symptoms. No increased thirst.   Review of Systems  Constitutional: Negative for activity change, appetite change, fatigue and unexpected weight change.  HENT: Negative for congestion, dental problem, ear pain, hearing loss, mouth sores, postnasal drip, rhinorrhea, sneezing, sore throat, tinnitus and trouble swallowing.   Eyes: Negative for photophobia, pain, redness and visual disturbance.  Respiratory: Negative for cough, chest tightness and shortness of breath.   Cardiovascular: Negative for chest pain, palpitations and leg swelling.  Gastrointestinal: Negative for abdominal pain, blood in stool, constipation, diarrhea, nausea and vomiting.  Endocrine: Negative.   Genitourinary: Negative for dysuria, frequency, hematuria and urgency.  Musculoskeletal: Negative for arthralgias, gait problem, myalgias and neck stiffness.  Skin: Negative for rash.  Allergic/Immunologic: Negative for environmental allergies, food allergies and immunocompromised state.  Neurological: Negative for dizziness, speech difficulty, weakness, light-headedness, numbness and headaches.  Hematological: Negative for adenopathy.  Psychiatric/Behavioral: Negative for confusion and sleep disturbance. The patient is not nervous/anxious.       Patient Active Problem List   Diagnosis Date Noted  . Diabetes mellitus type 2, uncomplicated (HCC) 01/22/2016  . Hypertriglyceridemia 03/16/2012  . Allergic rhinitis 03/16/2012  . HTN (hypertension) 03/16/2012  . BMI 31.0-31.9,adult 03/16/2012     Prior to Admission medications   Medication Sig Start Date End Date Taking? Authorizing Provider  aspirin 81 MG tablet Take 81 mg by mouth daily.   Yes Historical Provider, MD  atorvastatin (LIPITOR) 20 MG tablet Take 1 tablet (20 mg total) by mouth daily. 06/08/15  Yes Tonye Pearsonobert P Doolittle, MD  fish oil-omega-3 fatty acids 1000 MG capsule Take 1 g by mouth daily.    Yes Historical Provider, MD  Lactobacillus (PROBIOTIC ACIDOPHILUS PO) Take by mouth.   Yes Historical Provider, MD  lisinopril-hydrochlorothiazide (PRINZIDE,ZESTORETIC) 20-12.5 MG tablet Take 1 tablet by mouth daily. 06/07/15  Yes Tonye Pearsonobert P Doolittle, MD  metFORMIN (GLUCOPHAGE) 500 MG tablet Take 1 tablet (500 mg total) by mouth 2 (two) times daily with a meal. 02/04/16  Yes Vanda Waskey, PA-C     No Known Allergies     Objective:  Physical Exam  Constitutional: She is oriented to person, place, and time. She appears well-developed and well-nourished. She is active and cooperative. No distress.  BP 126/67 (BP Location: Right Arm, Patient Position: Sitting, Cuff Size: Normal)   Pulse 78   Temp 98.4 F (36.9 C) (Oral)   Resp 16   Ht 5\' 4"  (1.626 m)   Wt 163 lb (73.9 kg)   SpO2 94%   BMI 27.98 kg/m   HENT:  Head: Normocephalic and atraumatic.  Right Ear: Hearing normal.  Left Ear: Hearing normal.  Eyes: Conjunctivae are normal. No scleral icterus.  Neck: Normal range of motion. Neck supple. No thyromegaly present.  Cardiovascular: Normal rate, regular rhythm  and normal heart sounds.   Pulses:      Radial pulses are 2+ on the right side, and 2+ on the left side.  Pulmonary/Chest: Effort normal and breath sounds normal.  Lymphadenopathy:       Head (right side):  No tonsillar, no preauricular, no posterior auricular and no occipital adenopathy present.       Head (left side): No tonsillar, no preauricular, no posterior auricular and no occipital adenopathy present.    She has no cervical adenopathy.       Right: No supraclavicular adenopathy present.       Left: No supraclavicular adenopathy present.  Neurological: She is alert and oriented to person, place, and time. No sensory deficit.  Skin: Skin is warm, dry and intact. No rash noted. No cyanosis or erythema. Nails show no clubbing.  Psychiatric: She has a normal mood and affect. Her speech is normal and behavior is normal.    Wt Readings from Last 3 Encounters:  05/26/16 163 lb (73.9 kg)  02/18/16 177 lb 3.2 oz (80.4 kg)  02/04/16 176 lb (79.8 kg)       Assessment & Plan:   1. Type 2 diabetes mellitus without complication, without long-term current use of insulin (HCC) Await labs. Adjust regimen as indicated by results. Anticipate good control. She would like to stop metformin if possible. - Comprehensive metabolic panel - Hemoglobin A1c - HM DIABETES FOOT EXAM - HM DIABETES EYE EXAM  2. Essential hypertension Well controlled.  - CBC with Differential/Platelet - Comprehensive metabolic panel - lisinopril-hydrochlorothiazide (PRINZIDE,ZESTORETIC) 20-12.5 MG tablet; Take 1 tablet by mouth daily.  Dispense: 90 tablet; Refill: 3  3. Hypertriglyceridemia Await labs. Adjust regimen as indicated by results. - Comprehensive metabolic panel - Lipid panel - atorvastatin (LIPITOR) 20 MG tablet; Take 1 tablet (20 mg total) by mouth daily.  Dispense: 90 tablet; Refill: 3   Return in about 3 months (around 08/24/2016).   Fernande Bras, PA-C Physician Assistant-Certified [facility]

## 2016-05-26 NOTE — Patient Instructions (Addendum)
Keep up the great work making healthy lifestyle changes!    IF you received an x-ray today, you will receive an invoice from [facility]. Please contact [facility] at [phone] with questions or concerns regarding your invoice.   IF you received labwork today, you will receive an invoice from [facility]. Please contact [facility] at [phone] with questions or concerns regarding your invoice.   Our billing staff will not be able to assist you with questions regarding bills from these companies.  You will be contacted with the lab results as soon as they are available. The fastest way to get your results is to activate your My Chart account. Instructions are located on the last page of this paperwork. If you have not heard from us regarding the results in 2 weeks, please contact this office.

## 2016-05-27 LAB — HEMOGLOBIN A1C
ESTIMATED AVERAGE GLUCOSE: 128 mg/dL
Hgb A1c MFr Bld: 6.1 % — ABNORMAL HIGH (ref 4.8–5.6)

## 2016-05-27 LAB — COMPREHENSIVE METABOLIC PANEL
A/G RATIO: 1.6 (ref 1.2–2.2)
ALT: 23 IU/L (ref 0–32)
AST: 15 IU/L (ref 0–40)
Albumin: 4.3 g/dL (ref 3.5–4.8)
Alkaline Phosphatase: 141 IU/L — ABNORMAL HIGH (ref 39–117)
BUN/Creatinine Ratio: 23 (ref 12–28)
BUN: 16 mg/dL (ref 8–27)
Bilirubin Total: 0.9 mg/dL (ref 0.0–1.2)
CHLORIDE: 101 mmol/L (ref 96–106)
CO2: 22 mmol/L (ref 18–29)
Calcium: 9.7 mg/dL (ref 8.7–10.3)
Creatinine, Ser: 0.7 mg/dL (ref 0.57–1.00)
GFR, EST AFRICAN AMERICAN: 102 mL/min/{1.73_m2} (ref >59)
GFR, EST NON AFRICAN AMERICAN: 88 mL/min/{1.73_m2} (ref >59)
GLUCOSE: 93 mg/dL (ref 65–99)
Globulin, Total: 2.7 g/dL (ref 1.5–4.5)
POTASSIUM: 4.1 mmol/L (ref 3.5–5.2)
Sodium: 142 mmol/L (ref 134–144)
Total Protein: 7 g/dL (ref 6.0–8.5)

## 2016-05-27 LAB — CBC WITH DIFFERENTIAL/PLATELET
BASOS ABS: 0 10*3/uL (ref 0.0–0.2)
Basos: 0 %
EOS (ABSOLUTE): 0.2 10*3/uL (ref 0.0–0.4)
Eos: 2 %
Hematocrit: 42.6 % (ref 34.0–46.6)
Hemoglobin: 14.6 g/dL (ref 11.1–15.9)
IMMATURE GRANS (ABS): 0 10*3/uL (ref 0.0–0.1)
Immature Granulocytes: 0 %
LYMPHS: 25 %
Lymphocytes Absolute: 2.3 10*3/uL (ref 0.7–3.1)
MCH: 30.9 pg (ref 26.6–33.0)
MCHC: 34.3 g/dL (ref 31.5–35.7)
MCV: 90 fL (ref 79–97)
MONOS ABS: 0.8 10*3/uL (ref 0.1–0.9)
Monocytes: 9 %
NEUTROS PCT: 64 %
Neutrophils Absolute: 5.9 10*3/uL (ref 1.4–7.0)
PLATELETS: 273 10*3/uL (ref 150–379)
RBC: 4.73 x10E6/uL (ref 3.77–5.28)
RDW: 13.6 % (ref 12.3–15.4)
WBC: 9.3 10*3/uL (ref 3.4–10.8)

## 2016-05-27 LAB — LIPID PANEL
CHOLESTEROL TOTAL: 90 mg/dL — AB (ref 100–199)
Chol/HDL Ratio: 2.4 ratio units (ref 0.0–4.4)
HDL: 37 mg/dL — ABNORMAL LOW (ref >39)
LDL CALC: 1 mg/dL (ref 0–99)
TRIGLYCERIDES: 259 mg/dL — AB (ref 0–149)
VLDL Cholesterol Cal: 52 mg/dL — ABNORMAL HIGH (ref 5–40)

## 2016-10-27 ENCOUNTER — Telehealth: Payer: Self-pay

## 2016-10-27 NOTE — Telephone Encounter (Signed)
Called pt to schedule Medicare Annual Wellness Visit. -nr  

## 2017-01-21 ENCOUNTER — Other Ambulatory Visit: Payer: Self-pay | Admitting: Physician Assistant

## 2017-01-21 DIAGNOSIS — E119 Type 2 diabetes mellitus without complications: Secondary | ICD-10-CM

## 2017-02-22 ENCOUNTER — Other Ambulatory Visit: Payer: Self-pay | Admitting: Physician Assistant

## 2017-02-22 DIAGNOSIS — E119 Type 2 diabetes mellitus without complications: Secondary | ICD-10-CM

## 2017-03-05 IMAGING — CR DG CHEST 2V
2 series · 2 of 2 positions shown · non-contrast
Comparison: None available

CLINICAL DATA: Chest pain, left arm tingling x1 day, hypertension

EXAM:
CHEST - 2 VIEW

[w chest pa]
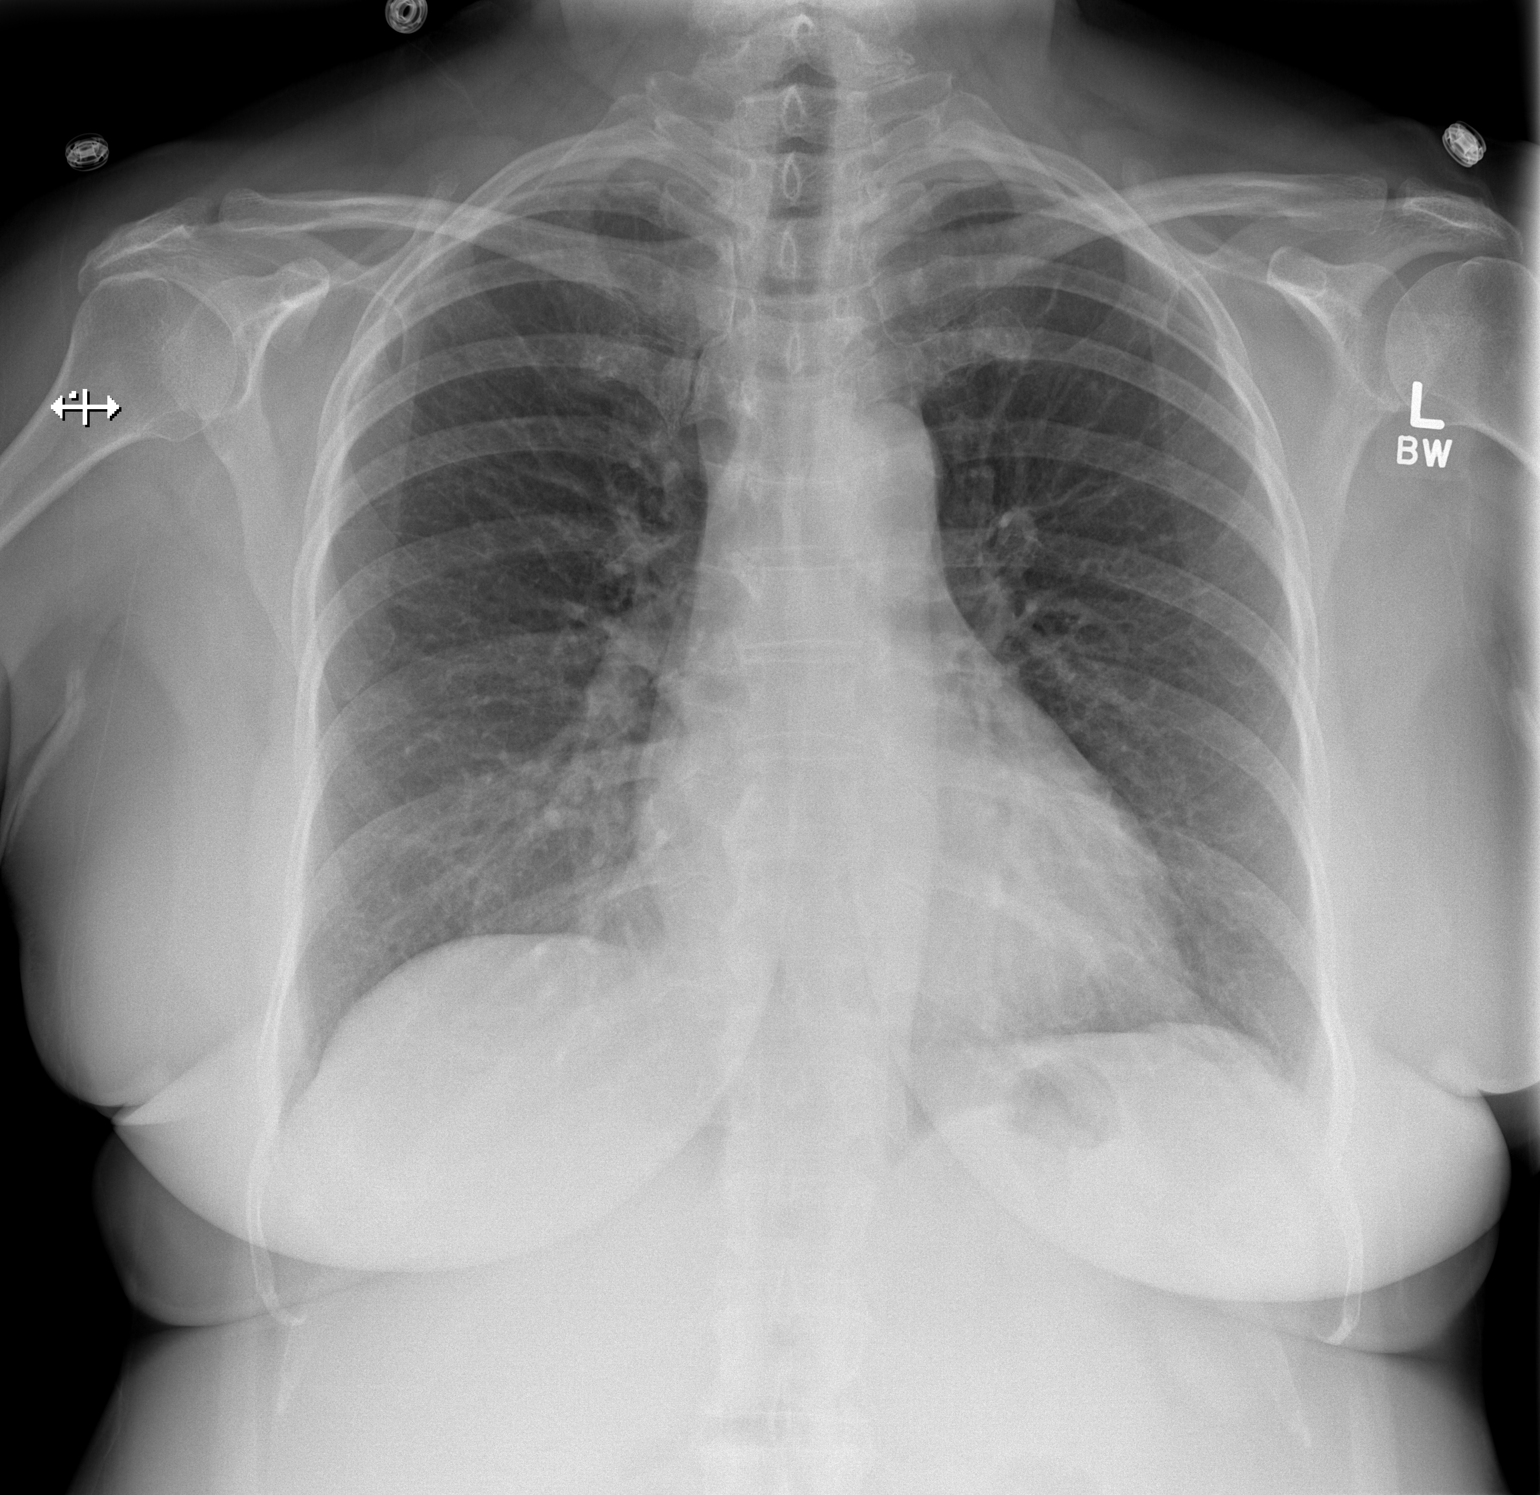

[w chest lat]
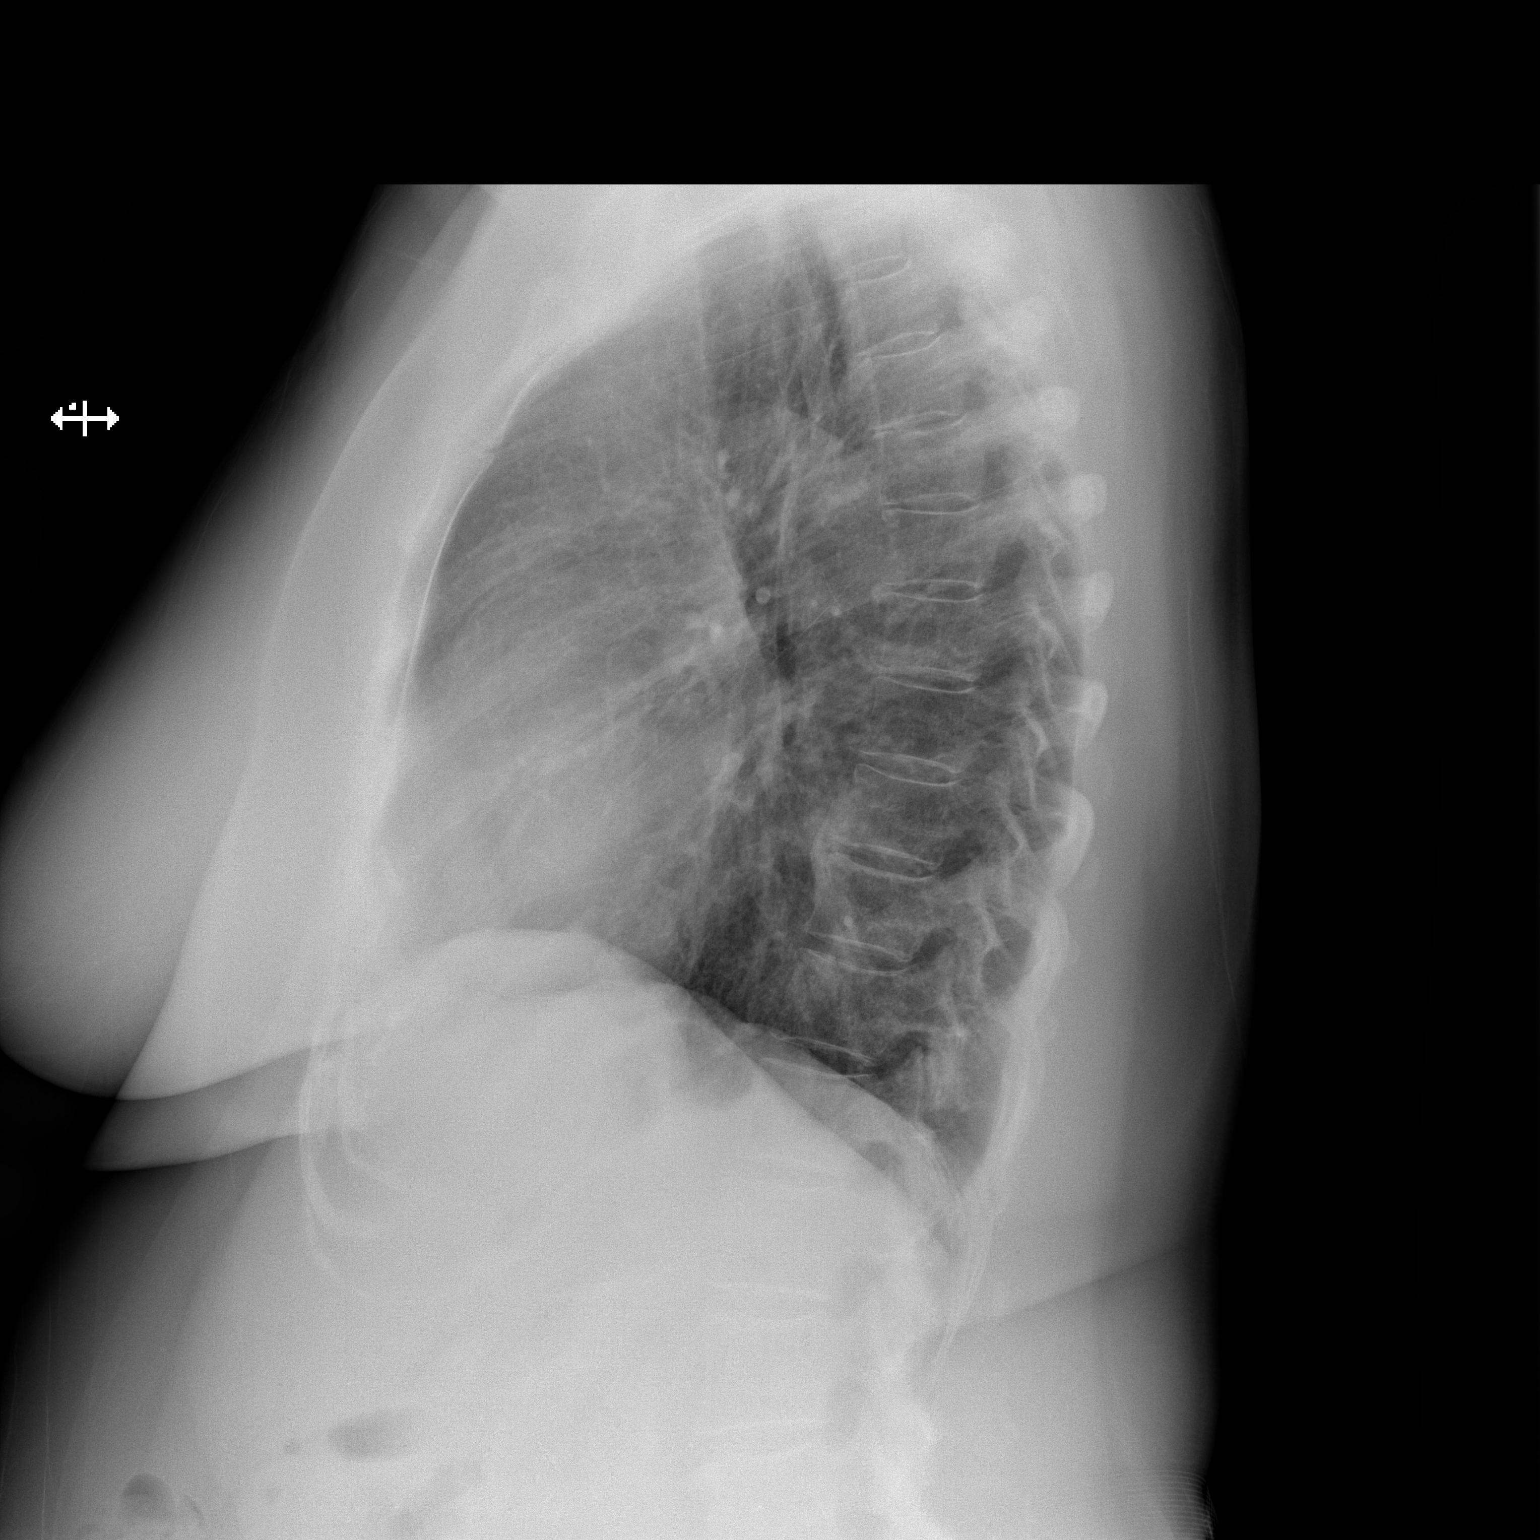

[2 of 2 positions shown; findings below may reference images not displayed]

FINDINGS: Lungs clear.  Borderline cardiomegaly.  No pneumothorax.
No effusion.
Minimal spurring in the mid thoracic spine.
IMPRESSION: No acute cardiopulmonary disease.

## 2017-05-23 ENCOUNTER — Other Ambulatory Visit: Payer: Self-pay | Admitting: Physician Assistant

## 2017-05-23 DIAGNOSIS — E781 Pure hyperglyceridemia: Secondary | ICD-10-CM

## 2017-05-23 DIAGNOSIS — I10 Essential (primary) hypertension: Secondary | ICD-10-CM

## 2017-05-24 NOTE — Telephone Encounter (Signed)
Needs OV.  It's been a yr since seen.

## 2017-08-16 ENCOUNTER — Encounter: Payer: Self-pay | Admitting: Physician Assistant

## 2019-06-06 ENCOUNTER — Ambulatory Visit: Payer: Medicare Other

## 2019-06-16 ENCOUNTER — Ambulatory Visit: Payer: Medicare PPO | Attending: Internal Medicine

## 2019-06-16 DIAGNOSIS — Z23 Encounter for immunization: Secondary | ICD-10-CM

## 2019-06-16 NOTE — Progress Notes (Signed)
   Covid-19 Vaccination Clinic  Name:  Alice [name]    MRN: [id number] DOB: [birth year]  06/16/2019  [name] was observed post Covid-19 immunization for 15 minutes without incidence. She was provided with Vaccine Information Sheet and instruction to access the V-Safe system.   [name] was instructed to call 911 with any severe reactions post vaccine: Marland Kitchen Difficulty breathing  . Swelling of your face and throat  . A fast heartbeat  . A bad rash all over your body  . Dizziness and weakness    Immunizations Administered    Name Date Dose VIS Date Route   Pfizer COVID-19 Vaccine 06/16/2019  3:49 PM 0.3 mL 04/21/2019 Intramuscular   Manufacturer: [employer], [employer]   Lot: [id number]   NDC: [address]-6

## 2019-06-23 ENCOUNTER — Ambulatory Visit: Payer: Medicare Other

## 2019-07-11 ENCOUNTER — Ambulatory Visit: Payer: Medicare PPO | Attending: Internal Medicine

## 2019-07-11 DIAGNOSIS — Z23 Encounter for immunization: Secondary | ICD-10-CM

## 2019-07-11 NOTE — Progress Notes (Signed)
   Covid-19 Vaccination Clinic  Name:  Alice [name]    MRN: [id number] DOB: 03-07-1946  07/11/2019  Alice [name] was observed post Covid-19 immunization for 15 minutes without incident. She was provided with Vaccine Information Sheet and instruction to access the V-Safe system.   Alice [name] was instructed to call 911 with any severe reactions post vaccine: Marland Kitchen Difficulty breathing  . Swelling of face and throat  . A fast heartbeat  . A bad rash all over body  . Dizziness and weakness   Immunizations Administered    Name Date Dose VIS Date Route   Pfizer COVID-19 Vaccine 07/11/2019  3:31 PM 0.3 mL 04/21/2019 Intramuscular   Manufacturer: [employer], [employer]   Lot: [id number]   NDC: [address]-6

## 2019-08-10 ENCOUNTER — Other Ambulatory Visit (HOSPITAL_COMMUNITY): Payer: Self-pay | Admitting: Family Medicine

## 2019-08-10 ENCOUNTER — Telehealth: Payer: Self-pay

## 2019-08-10 DIAGNOSIS — R011 Cardiac murmur, unspecified: Secondary | ICD-10-CM

## 2019-08-10 NOTE — Telephone Encounter (Signed)
NOTES ON FILE FROM [facility] [phone] SENT REFERRAL TO SCHEDULING

## 2019-08-25 ENCOUNTER — Ambulatory Visit (HOSPITAL_COMMUNITY): Payer: Medicare PPO | Attending: Cardiology

## 2019-08-25 ENCOUNTER — Other Ambulatory Visit: Payer: Self-pay

## 2019-08-25 DIAGNOSIS — R011 Cardiac murmur, unspecified: Secondary | ICD-10-CM | POA: Diagnosis not present

## 2019-08-28 ENCOUNTER — Ambulatory Visit: Payer: Medicare PPO | Admitting: Cardiology

## 2019-08-28 ENCOUNTER — Encounter: Payer: Self-pay | Admitting: Cardiology

## 2019-08-28 VITALS — BP 112/74 | HR 96 | Ht 64.0 in | Wt 174.0 lb

## 2019-08-28 DIAGNOSIS — I350 Nonrheumatic aortic (valve) stenosis: Secondary | ICD-10-CM | POA: Diagnosis not present

## 2019-08-28 DIAGNOSIS — I10 Essential (primary) hypertension: Secondary | ICD-10-CM | POA: Diagnosis not present

## 2019-08-28 DIAGNOSIS — R011 Cardiac murmur, unspecified: Secondary | ICD-10-CM | POA: Diagnosis not present

## 2019-08-28 NOTE — Patient Instructions (Signed)
Medication Instructions:  Your physician recommends that you continue on your current medications as directed. Please refer to the Current Medication list given to you today.  *If you need a refill on your cardiac medications before your next appointment, please call your pharmacy*  Testing/Procedures: Your physician has requested that you have an echocardiogram in one year. Echocardiography is a painless test that uses sound waves to create images of your heart. It provides your doctor with information about the size and shape of your heart and how well your heart's chambers and valves are working. This procedure takes approximately one hour. There are no restrictions for this procedure.  Follow-Up: At [facility], you and your health needs are our priority.  As part of our continuing mission to provide you with exceptional heart care, we have created designated Provider Care Teams.  These Care Teams include your primary Cardiologist (physician) and Advanced Practice Providers (APPs -  Physician Assistants and Nurse Practitioners) who all work together to provide you with the care you need, when you need it.  Your next appointment:   1 year(s)  The format for your next appointment:   In Person  Provider:   You may see Traci Turner, MD or one of the following Advanced Practice Providers on your designated Care Team:    Dayna Dunn, PA-C  Michele Lenze, PA-C   

## 2019-08-28 NOTE — Progress Notes (Signed)
Cardiology Consult Note    Date:  08/28/2019   ID:  Alleyne, Lac November 11, 1945, MRN [id number]  PCP:  Kelton Pillar, MD  Cardiologist:  Fransico Him, MD   Chief Complaint  Patient presents with  . New Patient (Initial Visit)    Heart murmur    History of Present Illness:  Alice [name] is a 74 y.o. female who is being seen today for the evaluation of heart murmur at the request of Kelton Pillar, MD.  This is a 74yo female with a hx of HTN and HLD who was noted to have a heart murmur at her PCP office and underwent 2D echo to assess showing normal LVF with G1DD and mild AS/AR.  She is not here for further evaluation.  She is here today for followup and is doing well.  She denies any chest pain or pressure, SOB, DOE, PND, orthopnea, LE edema, dizziness, palpitations or syncope. She is compliant with her meds and is tolerating meds with no SE.    Past Medical History:  Diagnosis Date  . Allergy   . Hyperlipidemia   . Hypertension     Past Surgical History:  Procedure Laterality Date  . CESAREAN SECTION     1 time  . TUBAL LIGATION      Current Medications: Current Meds  Medication Sig  . aspirin 81 MG tablet Take 81 mg by mouth daily.  . fish oil-omega-3 fatty acids 1000 MG capsule Take 1 g by mouth daily.   Marland Kitchen gemfibrozil (LOPID) 600 MG tablet Take 600 mg by mouth daily.  Marland Kitchen glimepiride (AMARYL) 2 MG tablet Take 2 mg by mouth daily.  . INVOKANA 100 MG TABS tablet Take 100 mg by mouth daily.  . Lactobacillus (PROBIOTIC ACIDOPHILUS PO) Take by mouth.  Marland Kitchen lisinopril-hydrochlorothiazide (PRINZIDE,ZESTORETIC) 20-12.5 MG tablet TAKE 1 TABLET BY MOUTH DAILY  . Psyllium (METAMUCIL PO) Take by mouth as directed.  Marland Kitchen VITAMIN D PO Take 1 capsule by mouth daily.  Marland Kitchen VITAMIN E PO Take 1 capsule by mouth daily.    Allergies:   Patient has no known allergies.   Social History   Socioeconomic History  . Marital status: Divorced    Spouse name: n/a  . Number of children: 2   . Years of education: college  . Highest education level: Not on file  Occupational History  . Occupation: retired    Comment: [employer]  Tobacco Use  . Smoking status: Never Smoker  . Smokeless tobacco: Never Used  Substance and Sexual Activity  . Alcohol use: Yes    Alcohol/week: 0.0 standard drinks    Comment: a glass of wine here and there  . Drug use: No  . Sexual activity: Not Currently  Other Topics Concern  . Not on file  Social History Narrative   Lives alone.   Son lives in [place], [place]   Daughter lives in [place], [place]   Exercise: Yes.   Social Determinants of Health   Financial Resource Strain:   . Difficulty of Paying Living Expenses:   Food Insecurity:   . Worried About Charity fundraiser in the Last Year:   . Arboriculturist in the Last Year:   Transportation Needs:   . Film/video editor (Medical):   Marland Kitchen Lack of Transportation (Non-Medical):   Physical Activity:   . Days of Exercise per Week:   . Minutes of Exercise per Session:   Stress:   . Feeling of Stress :  Social Connections:   . Frequency of Communication with Friends and Family:   . Frequency of Social Gatherings with Friends and Family:   . Attends Religious Services:   . Active Member of Clubs or Organizations:   . Attends Banker Meetings:   Marland Kitchen Marital Status:      Family History:  The patient's family history includes Diabetes in her mother; Heart disease in her father; Stroke in her father.   ROS:   Please see the history of present illness.    ROS All other systems reviewed and are negative.  No flowsheet data found.     PHYSICAL EXAM:   VS:  BP 112/74   Pulse 96   Ht 5\' 4"  (1.626 m)   Wt 174 lb (78.9 kg)   BMI 29.87 kg/m    GEN: Well nourished, well developed, in no acute distress  HEENT: normal  Neck: no JVD, carotid bruits, or masses Cardiac: RRR; no murmurs, rubs, or gallops,no edema.  Intact distal pulses bilaterally.  Respiratory:  clear to  auscultation bilaterally, normal work of breathing GI: soft, nontender, nondistended, + BS MS: no deformity or atrophy  Skin: warm and dry, no rash Neuro:  Alert and Oriented x 3, Strength and sensation are intact Psych: euthymic mood, full affect  Wt Readings from Last 3 Encounters:  08/28/19 174 lb (78.9 kg)  05/26/16 163 lb (73.9 kg)  02/18/16 177 lb 3.2 oz (80.4 kg)      Studies/Labs Reviewed:   EKG:  EKG is ordered today.  The ekg ordered today demonstrates NSR with no ST changes  Recent Labs: No results found for requested labs within last 8760 hours.   Lipid Panel    Component Value Date/Time   CHOL 90 (L) 05/26/2016 1455   TRIG 259 (H) 05/26/2016 1455   HDL 37 (L) 05/26/2016 1455   CHOLHDL 2.4 05/26/2016 1455   CHOLHDL 3.0 01/07/2016 1155   VLDL 60 (H) 01/07/2016 1155   LDLCALC 1 05/26/2016 1455   LDLDIRECT 61.7 07/06/2007 0912    Additional studies/ records that were reviewed today include:  Office notes from PCP    ASSESSMENT:    1. Heart murmur   2. Essential hypertension   3. Nonrheumatic aortic valve stenosis      PLAN:  In order of problems listed above:  1. Heart Murmur -2D echo showed mild aortic stenosis and regurgitation with mild AI.  The mean AVG is 16.81mmHg.  -repeat echo in 1 year  2.  HTN -BP controlled -Lisinopril HCT 20-12.5mg  daily  3.  Aortic stenosis -as above recent echo showed mild AS/AR -repeat in 1 year to make sure it is stable    Medication Adjustments/Labs and Tests Ordered: Current medicines are reviewed at length with the patient today.  Concerns regarding medicines are outlined above.  Medication changes, Labs and Tests ordered today are listed in the Patient Instructions below.  There are no Patient Instructions on file for this visit.   Signed, 11m, MD  08/28/2019 2:59 PM    [facility] [address] [place], [place], [place]  [place] Phone: [phone]; Fax: [phone]

## 2019-08-28 NOTE — Addendum Note (Signed)
Addended by: Theresia Majors on: 08/28/2019 03:09 PM   Modules accepted: Orders

## 2019-09-26 DIAGNOSIS — N760 Acute vaginitis: Secondary | ICD-10-CM | POA: Diagnosis not present

## 2019-09-26 DIAGNOSIS — M7661 Achilles tendinitis, right leg: Secondary | ICD-10-CM | POA: Diagnosis not present

## 2019-11-13 DIAGNOSIS — R05 Cough: Secondary | ICD-10-CM | POA: Diagnosis not present

## 2019-11-22 DIAGNOSIS — E1169 Type 2 diabetes mellitus with other specified complication: Secondary | ICD-10-CM | POA: Diagnosis not present

## 2019-11-24 ENCOUNTER — Ambulatory Visit
Admission: RE | Admit: 2019-11-24 | Discharge: 2019-11-24 | Disposition: A | Discharge status: Home / Self Care | Payer: Medicare PPO | Source: Ambulatory Visit | Attending: Family Medicine | Admitting: Family Medicine

## 2019-11-24 ENCOUNTER — Other Ambulatory Visit: Payer: Self-pay | Admitting: Family Medicine

## 2019-11-24 DIAGNOSIS — R059 Cough, unspecified: Secondary | ICD-10-CM

## 2019-11-24 DIAGNOSIS — R05 Cough: Secondary | ICD-10-CM | POA: Diagnosis not present

## 2020-02-08 DIAGNOSIS — E1169 Type 2 diabetes mellitus with other specified complication: Secondary | ICD-10-CM | POA: Diagnosis not present

## 2020-02-08 DIAGNOSIS — I10 Essential (primary) hypertension: Secondary | ICD-10-CM | POA: Diagnosis not present

## 2020-02-08 DIAGNOSIS — E782 Mixed hyperlipidemia: Secondary | ICD-10-CM | POA: Diagnosis not present

## 2020-02-08 DIAGNOSIS — Z23 Encounter for immunization: Secondary | ICD-10-CM | POA: Diagnosis not present

## 2020-02-08 DIAGNOSIS — E785 Hyperlipidemia, unspecified: Secondary | ICD-10-CM | POA: Diagnosis not present

## 2020-02-22 DIAGNOSIS — Z1231 Encounter for screening mammogram for malignant neoplasm of breast: Secondary | ICD-10-CM | POA: Diagnosis not present

## 2020-08-19 DIAGNOSIS — I10 Essential (primary) hypertension: Secondary | ICD-10-CM | POA: Diagnosis not present

## 2020-08-19 DIAGNOSIS — M109 Gout, unspecified: Secondary | ICD-10-CM | POA: Diagnosis not present

## 2020-08-19 DIAGNOSIS — E1169 Type 2 diabetes mellitus with other specified complication: Secondary | ICD-10-CM | POA: Diagnosis not present

## 2020-08-19 DIAGNOSIS — J309 Allergic rhinitis, unspecified: Secondary | ICD-10-CM | POA: Diagnosis not present

## 2020-08-19 DIAGNOSIS — Z1389 Encounter for screening for other disorder: Secondary | ICD-10-CM | POA: Diagnosis not present

## 2020-08-19 DIAGNOSIS — E782 Mixed hyperlipidemia: Secondary | ICD-10-CM | POA: Diagnosis not present

## 2020-08-19 DIAGNOSIS — I350 Nonrheumatic aortic (valve) stenosis: Secondary | ICD-10-CM | POA: Diagnosis not present

## 2020-08-19 DIAGNOSIS — R011 Cardiac murmur, unspecified: Secondary | ICD-10-CM | POA: Diagnosis not present

## 2020-08-19 DIAGNOSIS — Z0000 Encounter for general adult medical examination without abnormal findings: Secondary | ICD-10-CM | POA: Diagnosis not present

## 2020-09-03 ENCOUNTER — Ambulatory Visit (HOSPITAL_COMMUNITY): Payer: Medicare PPO | Attending: Cardiology

## 2020-09-03 ENCOUNTER — Encounter: Payer: Self-pay | Admitting: Cardiology

## 2020-09-03 ENCOUNTER — Other Ambulatory Visit: Payer: Self-pay

## 2020-09-03 DIAGNOSIS — I350 Nonrheumatic aortic (valve) stenosis: Secondary | ICD-10-CM | POA: Diagnosis not present

## 2020-09-03 LAB — ECHOCARDIOGRAM COMPLETE
AR max vel: 1.04 cm2
AV Area VTI: 0.95 cm2
AV Area mean vel: 0.94 cm2
AV Mean grad: 20 mmHg
AV Peak grad: 37.5 mmHg
Ao pk vel: 3.06 m/s
Area-P 1/2: 3.03 cm2
S' Lateral: 2.3 cm

## 2021-01-03 DIAGNOSIS — E1169 Type 2 diabetes mellitus with other specified complication: Secondary | ICD-10-CM | POA: Diagnosis not present

## 2021-01-03 DIAGNOSIS — E782 Mixed hyperlipidemia: Secondary | ICD-10-CM | POA: Diagnosis not present

## 2021-02-18 DIAGNOSIS — I10 Essential (primary) hypertension: Secondary | ICD-10-CM | POA: Diagnosis not present

## 2021-02-18 DIAGNOSIS — Z23 Encounter for immunization: Secondary | ICD-10-CM | POA: Diagnosis not present

## 2021-02-18 DIAGNOSIS — E1169 Type 2 diabetes mellitus with other specified complication: Secondary | ICD-10-CM | POA: Diagnosis not present

## 2021-02-18 DIAGNOSIS — E782 Mixed hyperlipidemia: Secondary | ICD-10-CM | POA: Diagnosis not present

## 2021-02-18 DIAGNOSIS — M109 Gout, unspecified: Secondary | ICD-10-CM | POA: Diagnosis not present

## 2021-02-24 DIAGNOSIS — E119 Type 2 diabetes mellitus without complications: Secondary | ICD-10-CM | POA: Diagnosis not present

## 2021-02-27 DIAGNOSIS — Z1231 Encounter for screening mammogram for malignant neoplasm of breast: Secondary | ICD-10-CM | POA: Diagnosis not present

## 2021-04-11 DIAGNOSIS — E1169 Type 2 diabetes mellitus with other specified complication: Secondary | ICD-10-CM | POA: Diagnosis not present

## 2021-04-11 DIAGNOSIS — M109 Gout, unspecified: Secondary | ICD-10-CM | POA: Diagnosis not present

## 2021-04-11 DIAGNOSIS — E782 Mixed hyperlipidemia: Secondary | ICD-10-CM | POA: Diagnosis not present

## 2021-08-28 DIAGNOSIS — Z1331 Encounter for screening for depression: Secondary | ICD-10-CM | POA: Diagnosis not present

## 2021-08-28 DIAGNOSIS — I350 Nonrheumatic aortic (valve) stenosis: Secondary | ICD-10-CM | POA: Diagnosis not present

## 2021-08-28 DIAGNOSIS — E782 Mixed hyperlipidemia: Secondary | ICD-10-CM | POA: Diagnosis not present

## 2021-08-28 DIAGNOSIS — Z0000 Encounter for general adult medical examination without abnormal findings: Secondary | ICD-10-CM | POA: Diagnosis not present

## 2021-08-28 DIAGNOSIS — I10 Essential (primary) hypertension: Secondary | ICD-10-CM | POA: Diagnosis not present

## 2021-08-28 DIAGNOSIS — K580 Irritable bowel syndrome with diarrhea: Secondary | ICD-10-CM | POA: Diagnosis not present

## 2021-08-28 DIAGNOSIS — M109 Gout, unspecified: Secondary | ICD-10-CM | POA: Diagnosis not present

## 2021-08-28 DIAGNOSIS — J309 Allergic rhinitis, unspecified: Secondary | ICD-10-CM | POA: Diagnosis not present

## 2021-08-28 DIAGNOSIS — E1169 Type 2 diabetes mellitus with other specified complication: Secondary | ICD-10-CM | POA: Diagnosis not present

## 2021-12-18 IMAGING — DX DG CHEST 2V
2 series · 2 of 2 positions shown · non-contrast
Comparison: 02/09/2015

CLINICAL DATA: Cough

EXAM:
CHEST - 2 VIEW

[dg chest 2 view (1 of 2)]
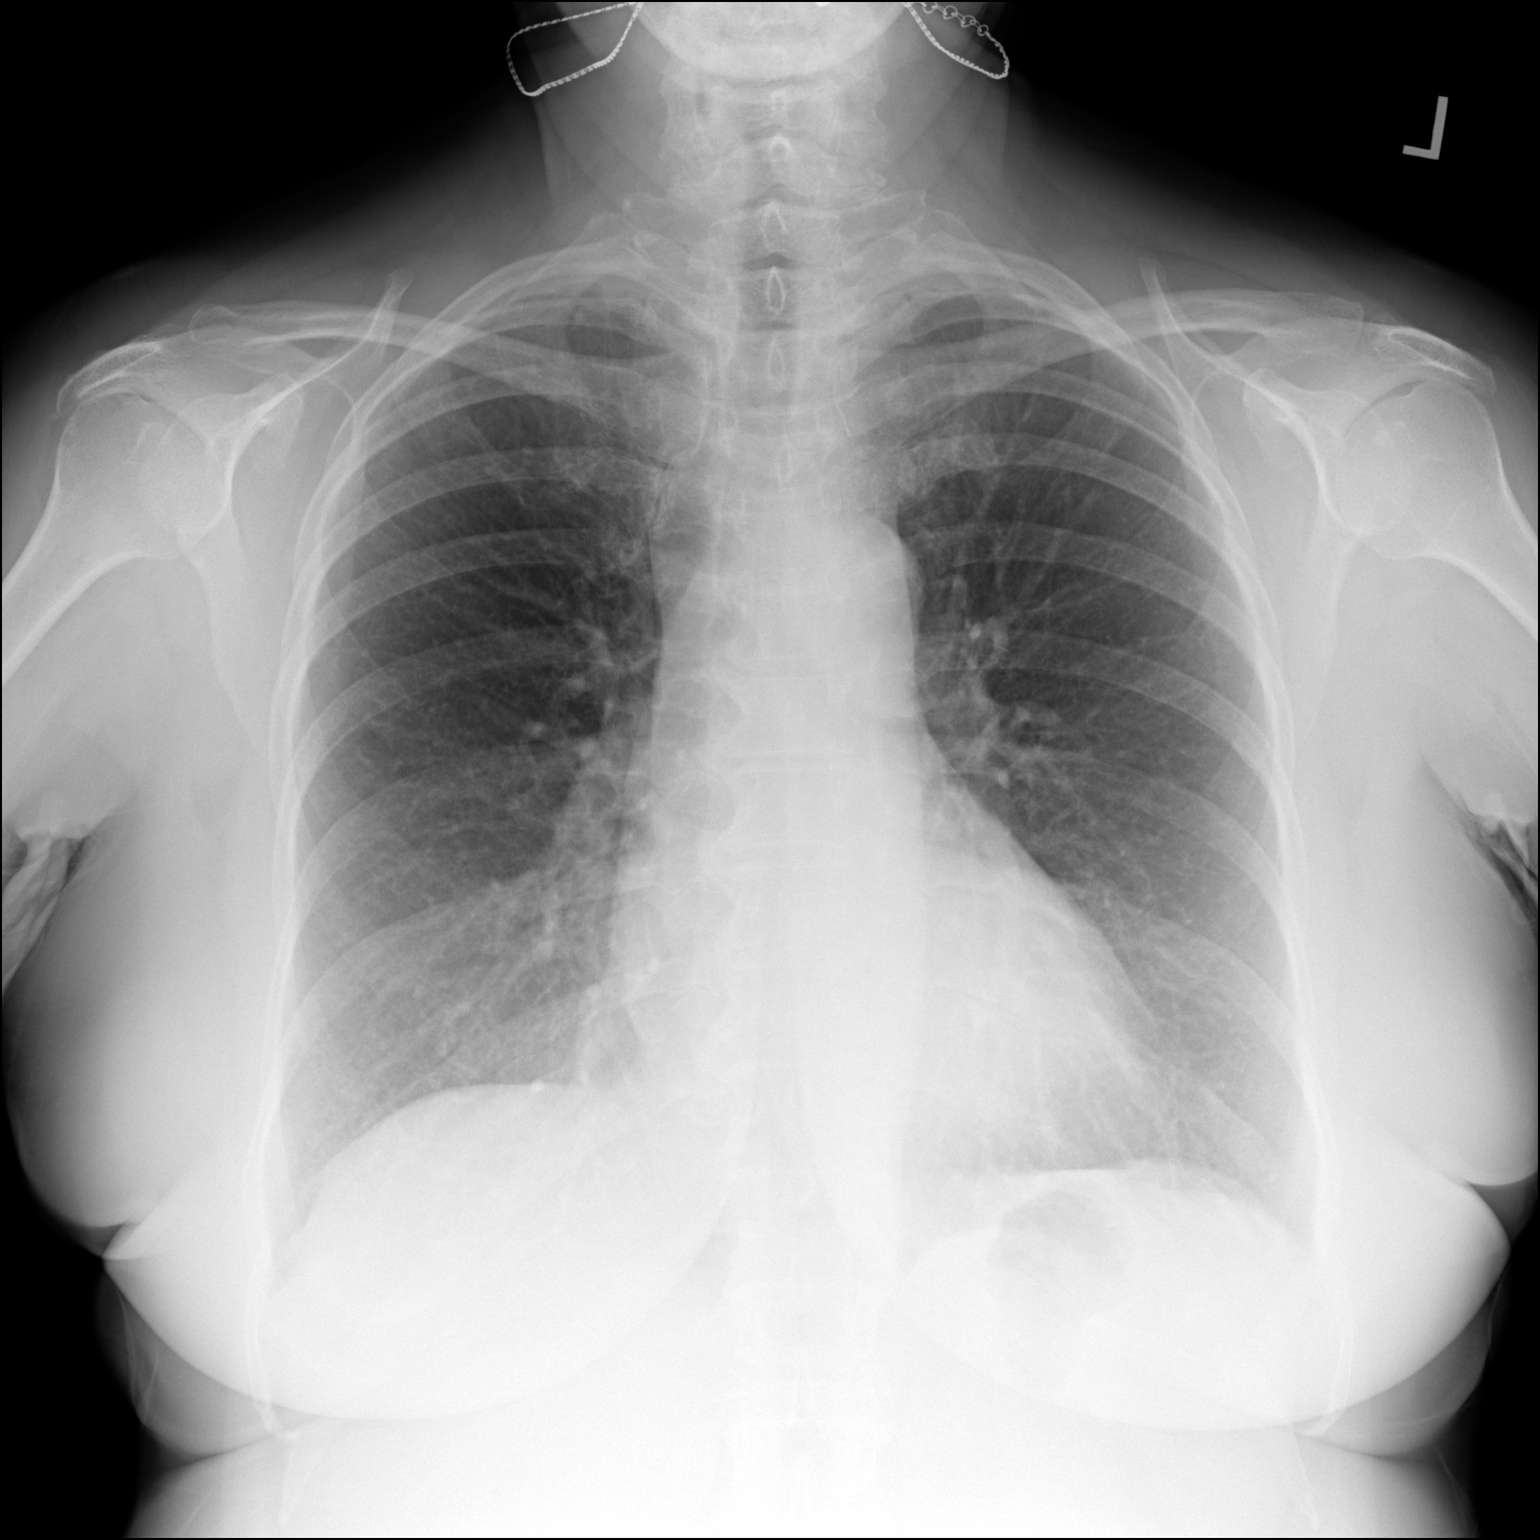

[dg chest 2 view (2 of 2)]
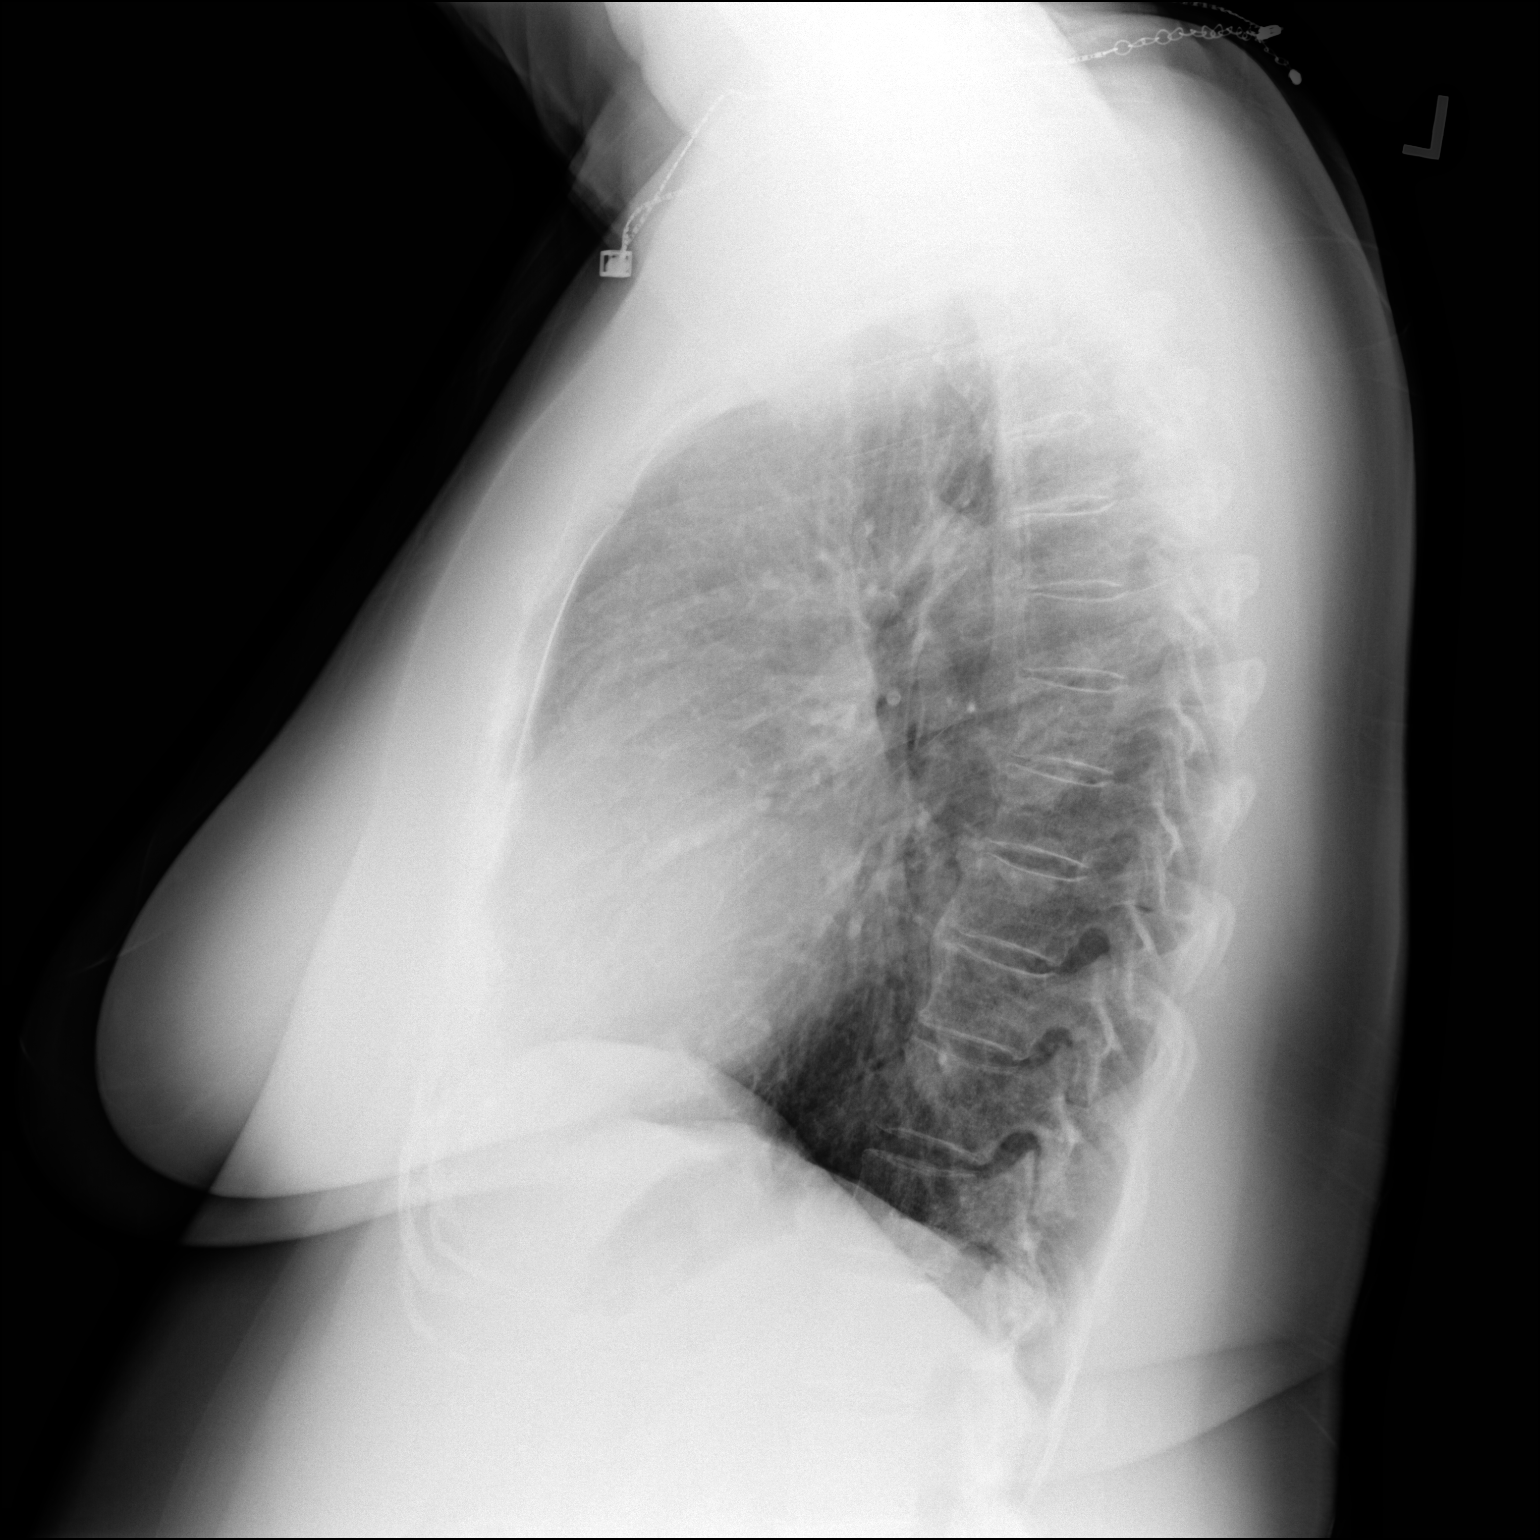

[2 of 2 positions shown; findings below may reference images not displayed]

FINDINGS: The heart size and mediastinal contours are within normal limits.
Both lungs are clear. The visualized skeletal structures are
unremarkable.
IMPRESSION: No active cardiopulmonary disease.

## 2022-01-19 DIAGNOSIS — L218 Other seborrheic dermatitis: Secondary | ICD-10-CM | POA: Diagnosis not present

## 2022-02-27 DIAGNOSIS — I350 Nonrheumatic aortic (valve) stenosis: Secondary | ICD-10-CM | POA: Diagnosis not present

## 2022-02-27 DIAGNOSIS — E1169 Type 2 diabetes mellitus with other specified complication: Secondary | ICD-10-CM | POA: Diagnosis not present

## 2022-02-27 DIAGNOSIS — Z23 Encounter for immunization: Secondary | ICD-10-CM | POA: Diagnosis not present

## 2022-02-27 DIAGNOSIS — I10 Essential (primary) hypertension: Secondary | ICD-10-CM | POA: Diagnosis not present

## 2022-02-27 DIAGNOSIS — I5189 Other ill-defined heart diseases: Secondary | ICD-10-CM | POA: Diagnosis not present

## 2022-02-27 DIAGNOSIS — E782 Mixed hyperlipidemia: Secondary | ICD-10-CM | POA: Diagnosis not present

## 2022-02-27 DIAGNOSIS — R011 Cardiac murmur, unspecified: Secondary | ICD-10-CM | POA: Diagnosis not present

## 2022-03-05 DIAGNOSIS — Z1231 Encounter for screening mammogram for malignant neoplasm of breast: Secondary | ICD-10-CM | POA: Diagnosis not present

## 2022-03-27 ENCOUNTER — Encounter: Payer: Self-pay | Admitting: Cardiovascular Disease

## 2022-03-27 ENCOUNTER — Ambulatory Visit: Payer: Medicare PPO | Attending: Cardiovascular Disease | Admitting: Cardiovascular Disease

## 2022-03-27 VITALS — BP 100/70 | HR 97 | Ht 64.0 in | Wt 145.2 lb

## 2022-03-27 DIAGNOSIS — I10 Essential (primary) hypertension: Secondary | ICD-10-CM | POA: Diagnosis not present

## 2022-03-27 DIAGNOSIS — I350 Nonrheumatic aortic (valve) stenosis: Secondary | ICD-10-CM

## 2022-03-27 NOTE — Progress Notes (Signed)
Chief Complaint  Patient presents with   Follow-up    Aortic stenosis    History of Present Illness: 76 yo female with history of HTN, hyperlipidemia and moderate aortic stenosis who is here today for follow up. She is added onto my DOD schedule today. She is followed by Dr. Mayford Knife. She was last seen in our office in April 2021. She was found to have mild aortic stenosis in 2021. Echo in April 2022 with LVEF=60-65%. Moderate aortic stenosis with mean gradient of 20 mmHg. She tells me today that she is here today to follow up on her heart murmur that was heard again in primary care. She feels great. She has no dyspnea with exertion, chest pain, dizziness, near syncope, palpitations or lower extremity edema. She is very active.   Primary Care Physician: Maurice Small, MD  Primary Cardiologist: Mayford Knife  Past Medical History:  Diagnosis Date   Allergy    Aortic stenosis    moderate by echo 08/2020   Hyperlipidemia    Hypertension     Past Surgical History:  Procedure Laterality Date   CESAREAN SECTION     1 time   TUBAL LIGATION      Current Outpatient Medications  Medication Sig Dispense Refill   aspirin 81 MG tablet Take 81 mg by mouth daily.     fish oil-omega-3 fatty acids 1000 MG capsule Take 1 g by mouth daily.      gemfibrozil (LOPID) 600 MG tablet Take 600 mg by mouth daily.     INVOKANA 100 MG TABS tablet Take 100 mg by mouth daily.     Lactobacillus (PROBIOTIC ACIDOPHILUS PO) Take by mouth.     losartan-hydrochlorothiazide (HYZAAR) 100-12.5 MG tablet Take 1 tablet by mouth daily.     OZEMPIC, 1 MG/DOSE, 4 MG/3ML SOPN Inject 1 mg as directed once a week.     Psyllium (METAMUCIL PO) Take by mouth as directed.     VASCEPA 1 g capsule Take 1 g by mouth in the morning, at noon, in the evening, and at bedtime.     No current facility-administered medications for this visit.    No Known Allergies  Social History   Socioeconomic History   Marital status: Divorced     Spouse name: n/a   Number of children: 2   Years of education: college   Highest education level: Not on file  Occupational History   Occupation: retired    Comment: [employer]  Tobacco Use   Smoking status: Never   Smokeless tobacco: Never  Substance and Sexual Activity   Alcohol use: Yes    Alcohol/week: 0.0 standard drinks of alcohol    Comment: a glass of wine here and there   Drug use: No   Sexual activity: Not Currently  Other Topics Concern   Not on file  Social History Narrative   Lives alone.   Son lives in [place], [place]   Daughter lives in [place], [place]   Exercise: Yes.   Social Determinants of Health   Financial Resource Strain: Not on file  Food Insecurity: Not on file  Transportation Needs: Not on file  Physical Activity: Not on file  Stress: Not on file  Social Connections: Not on file  Intimate Partner Violence: Not on file    Family History  Problem Relation Age of Onset   Diabetes Mother    Heart disease Father    Stroke Father     Review of Systems:  As stated  in the HPI and otherwise negative.   BP 100/70   Pulse 97   Ht 5\' 4"  (1.626 m)   Wt 145 lb 3.2 oz (65.9 kg)   SpO2 99%   BMI 24.92 kg/m   Physical Examination: General: Well developed, well nourished, NAD  HEENT: OP clear, mucus membranes moist  SKIN: warm, dry. No rashes. Neuro: No focal deficits  Musculoskeletal: Muscle strength 5/5 all ext  Psychiatric: Mood and affect normal  Neck: No JVD, no carotid bruits, no thyromegaly, no lymphadenopathy.  Lungs:Clear bilaterally, no wheezes, rhonci, crackles Cardiovascular: Regular rate and rhythm. Systolic murmur.  Abdomen:Soft. Bowel sounds present. Non-tender.  Extremities: No lower extremity edema. Pulses are 2 + in the bilateral DP/PT.  EKG:  EKG is ordered today. The ekg ordered today demonstrates NSR  Echo April 2022:  1. Left ventricular ejection fraction, by estimation, is 60 to 65%. The  left ventricle has normal  function. The left ventricle has no regional  wall motion abnormalities. Left ventricular diastolic parameters are  consistent with Grade I diastolic  dysfunction (impaired relaxation).   2. Right ventricular systolic function is normal. The right ventricular  size is normal. There is normal pulmonary artery systolic pressure. The  estimated right ventricular systolic pressure is 21.7 mmHg.   3. The mitral valve is normal in structure. No evidence of mitral valve  regurgitation. No evidence of mitral stenosis.   4. The aortic valve has an indeterminant number of cusps. There is severe  calcifcation of the aortic valve. There is severe thickening of the aortic  valve. Aortic valve regurgitation is not visualized. Moderate aortic valve  stenosis. Aortic valve mean  gradient measures 20.0 mmHg. Aortic valve Vmax measures 3.06 m/s.   5. The inferior vena cava is normal in size with greater than 50%  respiratory variability, suggesting right atrial pressure of 3 mmHg.   6. Compared to prior echo 08/2019, the P/M transaortic gradients have  increased from 31/17 to 38/61mmHg. The AV Vmax has increased from 2.26m/s  to 3.55m/s.   Recent Labs: No results found for requested labs within last 365 days.   Lipid Panel    Component Value Date/Time   CHOL 90 (L) 05/26/2016 1455   TRIG 259 (H) 05/26/2016 1455   HDL 37 (L) 05/26/2016 1455   CHOLHDL 2.4 05/26/2016 1455   CHOLHDL 3.0 01/07/2016 1155   VLDL 60 (H) 01/07/2016 1155   LDLCALC 1 05/26/2016 1455   LDLDIRECT 61.7 07/06/2007 0912     Wt Readings from Last 3 Encounters:  03/27/22 145 lb 3.2 oz (65.9 kg)  08/28/19 174 lb (78.9 kg)  05/26/16 163 lb (73.9 kg)    Assessment and Plan:   1. Aortic stenosis: Moderate in April 2022 with mean gradient 20 mmHg. Asymptomatic. Will repeat echo now.   2. HTN: BP is well controlled. No changes  Labs/ tests ordered today include:   Orders Placed This Encounter  Procedures   EKG 12-Lead    ECHOCARDIOGRAM COMPLETE   Disposition:   F/U with Dr. May 2022 in one year.   Signed, Mayford Knife, MD, [facility] 03/27/2022 9:56 AM    [facility] [address] [place], [place], [place]  [place] Phone: [phone]; Fax: [phone]

## 2022-03-27 NOTE — Patient Instructions (Signed)
Medication Instructions:  No changes  Lab Work: None   Testing/Procedures: Your physician has requested that you have an echocardiogram. Echocardiography is a painless test that uses sound waves to create images of your heart. It provides your doctor with information about the size and shape of your heart and how well your heart's chambers and valves are working. This procedure takes approximately one hour. There are no restrictions for this procedure. Please do NOT wear cologne, perfume, aftershave, or lotions (deodorant is allowed). Please arrive 15 minutes prior to your appointment time.    Follow-Up: At [facility], you and your health needs are our priority.  As part of our continuing mission to provide you with exceptional heart care, we have created designated Provider Care Teams.  These Care Teams include your primary Cardiologist (physician) and Advanced Practice Providers (APPs -  Physician Assistants and Nurse Practitioners) who all work together to provide you with the care you need, when you need it.   Your next appointment:   1 year(s)  The format for your next appointment:   In Person  Provider:   Armanda Magic, MD   Important Information About Sugar

## 2022-04-15 ENCOUNTER — Ambulatory Visit (HOSPITAL_COMMUNITY): Payer: Medicare PPO | Attending: Cardiovascular Disease

## 2022-04-15 DIAGNOSIS — I350 Nonrheumatic aortic (valve) stenosis: Secondary | ICD-10-CM | POA: Diagnosis not present

## 2022-04-15 LAB — ECHOCARDIOGRAM COMPLETE
AR max vel: 0.86 cm2
AV Area VTI: 0.89 cm2
AV Area mean vel: 0.77 cm2
AV Mean grad: 32 mmHg
AV Peak grad: 56 mmHg
Ao pk vel: 3.74 m/s
Area-P 1/2: 4.57 cm2
S' Lateral: 1.8 cm

## 2022-04-16 ENCOUNTER — Telehealth: Payer: Self-pay | Admitting: Cardiology

## 2022-04-16 DIAGNOSIS — I350 Nonrheumatic aortic (valve) stenosis: Secondary | ICD-10-CM

## 2022-04-16 NOTE — Telephone Encounter (Signed)
Patient is calling to receive results

## 2022-04-16 NOTE — Telephone Encounter (Signed)
Alice Hazel, MD 04/15/2022  1:28 PM EST     Pt of Dr. Mayford Knife. I saw her as DOD add on. She is feeling great. Echo to evaluate AS. She is known to have moderate AS. This is still moderate but has gotten a little worse. Given lack of symptoms, repeat echo one year. Follow up as planned with Dr. Mayford Knife. Thayer Ohm   The patient has been notified of the result and verbalized understanding.  All questions (if any) were answered. Ethelda Chick, RN 04/16/2022 10:41 AM   Will place order for echo.

## 2022-08-19 DIAGNOSIS — L578 Other skin changes due to chronic exposure to nonionizing radiation: Secondary | ICD-10-CM | POA: Diagnosis not present

## 2022-08-19 DIAGNOSIS — L570 Actinic keratosis: Secondary | ICD-10-CM | POA: Diagnosis not present

## 2022-08-19 DIAGNOSIS — D1801 Hemangioma of skin and subcutaneous tissue: Secondary | ICD-10-CM | POA: Diagnosis not present

## 2022-08-19 DIAGNOSIS — L821 Other seborrheic keratosis: Secondary | ICD-10-CM | POA: Diagnosis not present

## 2022-08-19 DIAGNOSIS — D229 Melanocytic nevi, unspecified: Secondary | ICD-10-CM | POA: Diagnosis not present

## 2022-08-19 DIAGNOSIS — L814 Other melanin hyperpigmentation: Secondary | ICD-10-CM | POA: Diagnosis not present

## 2022-09-02 DIAGNOSIS — I5189 Other ill-defined heart diseases: Secondary | ICD-10-CM | POA: Diagnosis not present

## 2022-09-02 DIAGNOSIS — I350 Nonrheumatic aortic (valve) stenosis: Secondary | ICD-10-CM | POA: Diagnosis not present

## 2022-09-02 DIAGNOSIS — E1169 Type 2 diabetes mellitus with other specified complication: Secondary | ICD-10-CM | POA: Diagnosis not present

## 2022-09-02 DIAGNOSIS — M109 Gout, unspecified: Secondary | ICD-10-CM | POA: Diagnosis not present

## 2022-09-02 DIAGNOSIS — K580 Irritable bowel syndrome with diarrhea: Secondary | ICD-10-CM | POA: Diagnosis not present

## 2022-09-02 DIAGNOSIS — E1165 Type 2 diabetes mellitus with hyperglycemia: Secondary | ICD-10-CM | POA: Diagnosis not present

## 2022-09-02 DIAGNOSIS — I10 Essential (primary) hypertension: Secondary | ICD-10-CM | POA: Diagnosis not present

## 2022-09-02 DIAGNOSIS — R011 Cardiac murmur, unspecified: Secondary | ICD-10-CM | POA: Diagnosis not present

## 2022-09-02 DIAGNOSIS — E782 Mixed hyperlipidemia: Secondary | ICD-10-CM | POA: Diagnosis not present

## 2022-09-02 DIAGNOSIS — Z0000 Encounter for general adult medical examination without abnormal findings: Secondary | ICD-10-CM | POA: Diagnosis not present

## 2022-09-11 DIAGNOSIS — Z13820 Encounter for screening for osteoporosis: Secondary | ICD-10-CM | POA: Diagnosis not present

## 2022-09-11 DIAGNOSIS — E2839 Other primary ovarian failure: Secondary | ICD-10-CM | POA: Diagnosis not present

## 2022-09-11 DIAGNOSIS — N951 Menopausal and female climacteric states: Secondary | ICD-10-CM | POA: Diagnosis not present

## 2022-10-13 DIAGNOSIS — L570 Actinic keratosis: Secondary | ICD-10-CM | POA: Diagnosis not present

## 2023-01-06 DIAGNOSIS — D485 Neoplasm of uncertain behavior of skin: Secondary | ICD-10-CM | POA: Diagnosis not present

## 2023-01-06 DIAGNOSIS — L680 Hirsutism: Secondary | ICD-10-CM | POA: Diagnosis not present

## 2023-01-06 DIAGNOSIS — D0439 Carcinoma in situ of skin of other parts of face: Secondary | ICD-10-CM | POA: Diagnosis not present

## 2023-03-03 DIAGNOSIS — D099 Carcinoma in situ, unspecified: Secondary | ICD-10-CM | POA: Diagnosis not present

## 2023-03-03 DIAGNOSIS — T50905A Adverse effect of unspecified drugs, medicaments and biological substances, initial encounter: Secondary | ICD-10-CM | POA: Diagnosis not present

## 2023-03-04 DIAGNOSIS — E1165 Type 2 diabetes mellitus with hyperglycemia: Secondary | ICD-10-CM | POA: Diagnosis not present

## 2023-03-04 DIAGNOSIS — I5189 Other ill-defined heart diseases: Secondary | ICD-10-CM | POA: Diagnosis not present

## 2023-03-04 DIAGNOSIS — Z23 Encounter for immunization: Secondary | ICD-10-CM | POA: Diagnosis not present

## 2023-03-04 DIAGNOSIS — I350 Nonrheumatic aortic (valve) stenosis: Secondary | ICD-10-CM | POA: Diagnosis not present

## 2023-03-04 DIAGNOSIS — I10 Essential (primary) hypertension: Secondary | ICD-10-CM | POA: Diagnosis not present

## 2023-03-04 DIAGNOSIS — E782 Mixed hyperlipidemia: Secondary | ICD-10-CM | POA: Diagnosis not present

## 2023-03-11 DIAGNOSIS — Z1231 Encounter for screening mammogram for malignant neoplasm of breast: Secondary | ICD-10-CM | POA: Diagnosis not present

## 2023-03-12 ENCOUNTER — Ambulatory Visit (HOSPITAL_COMMUNITY): Payer: Medicare PPO | Attending: Cardiology

## 2023-03-12 DIAGNOSIS — I350 Nonrheumatic aortic (valve) stenosis: Secondary | ICD-10-CM | POA: Diagnosis not present

## 2023-03-12 LAB — ECHOCARDIOGRAM COMPLETE
AR max vel: 0.7 cm2
AV Area VTI: 0.75 cm2
AV Area mean vel: 0.7 cm2
AV Mean grad: 28.5 mm[Hg]
AV Peak grad: 50.7 mm[Hg]
Ao pk vel: 3.56 m/s
Area-P 1/2: 4.71 cm2
S' Lateral: 2.2 cm

## 2023-03-14 ENCOUNTER — Encounter: Payer: Self-pay | Admitting: Cardiology

## 2023-03-15 ENCOUNTER — Telehealth: Payer: Self-pay

## 2023-03-15 NOTE — Telephone Encounter (Signed)
Left message per DPR asking patient to call back to discuss her echo results.

## 2023-03-15 NOTE — Telephone Encounter (Signed)
-----   Message from Armanda Magic sent at 03/14/2023  8:07 PM EST ----- Echo showed normal pumping function of heart with mildly thickened heart muscle and increased stiffness of heart seen with aging.  There is severe calcification of the AV with severe AS which is slightly worse.  Please find out if she has had any CP, SOB, LE edema, dizziness or passing out or exertional fatigue

## 2023-04-07 ENCOUNTER — Telehealth: Payer: Self-pay

## 2023-04-07 NOTE — Telephone Encounter (Signed)
-----  Message from Armanda Magic sent at 03/14/2023  8:07 PM EST ----- Echo showed normal pumping function of heart with mildly thickened heart muscle and increased stiffness of heart seen with aging.  There is severe calcification of the AV with severe AS which is slightly worse.  Please find out if she has had any CP, SOB, LE edema, dizziness or passing out or exertional fatigue

## 2023-04-07 NOTE — Telephone Encounter (Signed)
Call to patient to discuss Echo results and symptoms, no answer, called all #'s listed in Epic. Left messages with no patient identifiers asking recipient to call [place] at our office.

## 2023-04-12 ENCOUNTER — Encounter (HOSPITAL_BASED_OUTPATIENT_CLINIC_OR_DEPARTMENT_OTHER): Payer: Self-pay | Admitting: Cardiology

## 2023-04-20 ENCOUNTER — Telehealth: Payer: Self-pay

## 2023-04-20 DIAGNOSIS — I350 Nonrheumatic aortic (valve) stenosis: Secondary | ICD-10-CM

## 2023-04-20 NOTE — Telephone Encounter (Signed)
Called and spoke to patient regarding Dr. Debby Bud recommendations:  "She has moderate AS in 2022. Progressed to moderate to severe AS in 2023.  Given her lack on symptoms, a conservative approach was taken. In 2024, she has progressed to severe AS.  She is still asymptomatic. As a minimum, she should get a repeat echocardiogram in one year.  If she has any of the sx Dr. Mayford Knife asked about, we would recommend assessment for valve replacement."  Patient verbalizes understanding of the above and states she would like to come in and talk to Dr. Mayford Knife in person about symptoms to look out for and possible treatment plan if she develops symptoms. Appt made for February 2025. Order placed for echo in one year.

## 2023-07-01 ENCOUNTER — Ambulatory Visit: Payer: Medicare PPO | Admitting: Cardiology

## 2023-08-17 ENCOUNTER — Encounter: Payer: Self-pay | Admitting: Cardiology

## 2023-08-17 ENCOUNTER — Ambulatory Visit: Payer: Medicare PPO | Attending: Cardiology | Admitting: Cardiology

## 2023-08-17 VITALS — BP 107/69 | HR 101 | Ht 63.0 in | Wt 147.0 lb

## 2023-08-17 DIAGNOSIS — I10 Essential (primary) hypertension: Secondary | ICD-10-CM

## 2023-08-17 DIAGNOSIS — I350 Nonrheumatic aortic (valve) stenosis: Secondary | ICD-10-CM

## 2023-08-17 NOTE — Progress Notes (Signed)
 Cardiology CONSULT Note    Date:  08/17/2023   ID:  Alice, [name] 1946-02-08, MRN [id number]  PCP:  Maurice Small, MD (Inactive)  Cardiologist:  Armanda Magic, MD   Chief Complaint  Patient presents with   New Patient (Initial Visit)    Severe aortic stenosis    Patient Profile: Alice [name] is a 78 y.o. female who is being seen today for the evaluation of heart murmur at the request of Ardean Larsen, MD  History of Present Illness:  Alice [name] is a 78 y.o. female who is being seen today for the evaluation of heart murmur at the request of Ardean Larsen, MD  This is a 78 year old female with a history of hyperlipidemia hypertension and heart murmur.She had a 2D echo done in 2021 showing tricuspid aortic valve with moderate aortic valve stenosis mean aortic valve gradient 16 mmHg with mild AI.  She has had serial echoes done by her PCP yearly since then.  Her last echo was done 03/12/2023 showing EF 60 to 65% with mild LVH, G1 DD and a heavily calcified aortic valve with severe aortic stenosis, mean aortic valve gradient 28.5 mmHg, AVA 0.75 cm (VTI), DVI 0.24 and SVI 25 all consistent with paradoxical low-flow low gradient severe aortic stenosis.  She is now referred for further cardiac evaluation.  She denies any chest pain or pressure but has had some DOE if she really exerts herself. She is able to do her ADLs with no SOB. She denies  PND, orthopnea, LE edema, palpitations or syncope. She does get dizzy from time to time when se stands up too fast. She is compliant with her meds and is tolerating meds with no SE.    Past Medical History:  Diagnosis Date   Allergy    Aortic stenosis    severe low flow low gradient AS by echo 02/2023   Hyperlipidemia    Hypertension     Past Surgical History:  Procedure Laterality Date   CESAREAN SECTION     1 time   TUBAL LIGATION      Current Medications: Current Meds  Medication Sig   aspirin 81 MG tablet Take 81 mg by  mouth daily.   gemfibrozil (LOPID) 600 MG tablet Take 600 mg by mouth daily.   INVOKANA 100 MG TABS tablet Take 100 mg by mouth daily.   Lactobacillus (PROBIOTIC ACIDOPHILUS PO) Take by mouth.   losartan-hydrochlorothiazide (HYZAAR) 100-12.5 MG tablet Take 1 tablet by mouth daily.   OZEMPIC, 2 MG/DOSE, 8 MG/3ML SOPN Inject 2 mg into the skin once a week.   VASCEPA 1 g capsule Take 1 g by mouth in the morning, at noon, in the evening, and at bedtime.    Allergies:   Patient has no known allergies.   Social History   Socioeconomic History   Marital status: Divorced    Spouse name: n/a   Number of children: 2   Years of education: college   Highest education level: Not on file  Occupational History   Occupation: retired    Comment: [employer]  Tobacco Use   Smoking status: Never   Smokeless tobacco: Never  Substance and Sexual Activity   Alcohol use: Yes    Alcohol/week: 0.0 standard drinks of alcohol    Comment: a glass of wine here and there   Drug use: No   Sexual activity: Not Currently  Other Topics Concern   Not on file  Social History  Narrative   Lives alone.   Son lives in [place], [place]   Daughter lives in [place], [place]   Exercise: Yes.   Social Drivers of Corporate investment banker Strain: Not on file  Food Insecurity: Not on file  Transportation Needs: Not on file  Physical Activity: Not on file  Stress: Not on file  Social Connections: Not on file     Family History:  The patient's family history includes Diabetes in her mother; Heart disease in her father; Stroke in her father.   ROS:   Please see the history of present illness.    ROS All other systems reviewed and are negative.      No data to display           PHYSICAL EXAM:   VS:  BP 107/69   Pulse (!) 101   Ht 5\' 3"  (1.6 m)   Wt 147 lb (66.7 kg)   SpO2 97%   BMI 26.04 kg/m    GEN: Well nourished, well developed, in no acute distress  HEENT: normal  Neck: no JVD, carotid bruits, or  masses Cardiac: RRR; no rubs, or gallops,no edema.  Intact distal pulses bilaterally.  2/6 late peaking systolic murmur at the right upper sternal border to the left upper sternal border Respiratory:  clear to auscultation bilaterally, normal work of breathing GI: soft, nontender, nondistended, + BS MS: no deformity or atrophy  Skin: warm and dry, no rash Neuro:  Alert and Oriented x 3, Strength and sensation are intact Psych: euthymic mood, full affect  Wt Readings from Last 3 Encounters:  08/17/23 147 lb (66.7 kg)  03/27/22 145 lb 3.2 oz (65.9 kg)  08/28/19 174 lb (78.9 kg)      Studies/Labs Reviewed:   EKG Interpretation Date/Time:  Tuesday August 17 2023 08:53:50 EDT Ventricular Rate:  101 PR Interval:  176 QRS Duration:  78 QT Interval:  352 QTC Calculation: 456 R Axis:   14  Text Interpretation: Sinus tachycardia When compared with ECG of 09-Feb-2015 12:54, PREVIOUS ECG IS PRESENT Confirmed by Armanda Magic [phone]) on 08/17/2023 9:06:49 AM  EKG Interpretation Date/Time:  Tuesday August 17 2023 08:53:50 EDT Ventricular Rate:  101 PR Interval:  176 QRS Duration:  78 QT Interval:  352 QTC Calculation: 456 R Axis:   14  Text Interpretation: Sinus tachycardia When compared with ECG of 09-Feb-2015 12:54, PREVIOUS ECG IS PRESENT Confirmed by Armanda Magic ([id number]) on 08/17/2023 9:06:49 AM   Cardiac Studies & Procedures   ______________________________________________________________________________________________     ECHOCARDIOGRAM  ECHOCARDIOGRAM COMPLETE 03/12/2023  Narrative ECHOCARDIOGRAM REPORT    Patient Name:   Alice [name] Date of Exam: 03/12/2023 Medical Rec #:  [id number]       Height:       64.0 in Accession #:    [id number]      Weight:       145.2 lb Date of Birth:  [birth year]        BSA:          1.707 m Patient Age:    78 years        BP:           100/70 mmHg Patient Gender: F               HR:           97 bpm. Exam Location:  [address]  Procedure: 2D Echo, Cardiac Doppler, Color Doppler and Strain Analysis  Indications:  Aortic stenosis I35.0  History:        Patient has prior history of Echocardiogram examinations, most recent 04/15/2022. Risk Factors:Hypertension and Dyslipidemia.  Sonographer:    Thurman Coyer RDCS Referring Phys: [phone] Jazelle Achey R Wojciech Willetts  IMPRESSIONS   1. Calcified aortic valve with severe low flow low gradient AS (mean gradient mmHg ; AVA 0.75 cm2; DI 0.24). 2. Left ventricular ejection fraction, by estimation, is 60 to 65%. The left ventricle has normal function. The left ventricle has no regional wall motion abnormalities. There is mild concentric left ventricular hypertrophy. Left ventricular diastolic parameters are consistent with Grade I diastolic dysfunction (impaired relaxation). 3. Right ventricular systolic function is normal. The right ventricular size is normal. Tricuspid regurgitation signal is inadequate for assessing PA pressure. 4. The mitral valve is normal in structure. No evidence of mitral valve regurgitation. No evidence of mitral stenosis. 5. The aortic valve is calcified. Aortic valve regurgitation is not visualized. Severe aortic valve stenosis. 6. The inferior vena cava is dilated in size with >50% respiratory variability, suggesting right atrial pressure of 8 mmHg.  Comparison(s): No significant change from prior study.  FINDINGS Left Ventricle: Left ventricular ejection fraction, by estimation, is 60 to 65%. The left ventricle has normal function. The left ventricle has no regional wall motion abnormalities. The left ventricular internal cavity size was normal in size. There is mild concentric left ventricular hypertrophy. Left ventricular diastolic parameters are consistent with Grade I diastolic dysfunction (impaired relaxation).  Right Ventricle: The right ventricular size is normal. Right ventricular systolic function is normal. Tricuspid regurgitation  signal is inadequate for assessing PA pressure. The tricuspid regurgitant velocity is 2.10 m/s, and with an assumed right atrial pressure of 8 mmHg, the estimated right ventricular systolic pressure is 25.6 mmHg.  Left Atrium: Left atrial size was normal in size.  Right Atrium: Right atrial size was normal in size.  Pericardium: There is no evidence of pericardial effusion.  Mitral Valve: The mitral valve is normal in structure. No evidence of mitral valve regurgitation. No evidence of mitral valve stenosis.  Tricuspid Valve: The tricuspid valve is normal in structure. Tricuspid valve regurgitation is mild . No evidence of tricuspid stenosis.  Aortic Valve: The aortic valve is calcified. Aortic valve regurgitation is not visualized. Severe aortic stenosis is present. Aortic valve mean gradient measures 28.5 mmHg. Aortic valve peak gradient measures 50.7 mmHg. Aortic valve area, by VTI measures 0.75 cm.  Pulmonic Valve: The pulmonic valve was normal in structure. Pulmonic valve regurgitation is not visualized. No evidence of pulmonic stenosis.  Aorta: The aortic root is normal in size and structure.  Venous: The inferior vena cava is dilated in size with greater than 50% respiratory variability, suggesting right atrial pressure of 8 mmHg.  IAS/Shunts: No atrial level shunt detected by color flow Doppler.  Additional Comments: Calcified aortic valve with severe low flow low gradient AS (mean gradient mmHg ; AVA 0.75 cm2; DI 0.24).   LEFT VENTRICLE PLAX 2D LVIDd:         3.10 cm   Diastology LVIDs:         2.20 cm   LV e' medial:    5.87 cm/s LV PW:         1.10 cm   LV E/e' medial:  9.4 LV IVS:        1.10 cm   LV e' lateral:   7.18 cm/s LVOT diam:     2.00 cm   LV E/e' lateral: 7.7  LV SV:         43 LV SV Index:   25 LVOT Area:     3.14 cm   RIGHT VENTRICLE            IVC RV Basal diam:  1.70 cm    IVC diam: 2.40 cm RV S prime:     8.81 cm/s TAPSE (M-mode): 1.8  cm  LEFT ATRIUM             Index        RIGHT ATRIUM          Index LA diam:        3.40 cm 1.99 cm/m   RA Area:     9.68 cm LA Vol (A2C):   24.2 ml 14.17 ml/m  RA Volume:   16.90 ml 9.90 ml/m LA Vol (A4C):   28.8 ml 16.87 ml/m LA Biplane Vol: 27.9 ml 16.34 ml/m AORTIC VALVE AV Area (Vmax):    0.70 cm AV Area (Vmean):   0.70 cm AV Area (VTI):     0.75 cm AV Vmax:           356.00 cm/s AV Vmean:          247.500 cm/s AV VTI:            0.572 m AV Peak Grad:      50.7 mmHg AV Mean Grad:      28.5 mmHg LVOT Vmax:         79.70 cm/s LVOT Vmean:        55.000 cm/s LVOT VTI:          0.136 m LVOT/AV VTI ratio: 0.24  AORTA Ao Root diam: 2.80 cm Ao Asc diam:  3.60 cm  MITRAL VALVE               TRICUSPID VALVE MV Area (PHT): 4.71 cm    TR Peak grad:   17.6 mmHg MV Decel Time: 161 msec    TR Vmax:        210.00 cm/s MV E velocity: 55.00 cm/s MV A velocity: 89.60 cm/s  SHUNTS MV E/A ratio:  0.61        Systemic VTI:  0.14 m Systemic Diam: 2.00 cm  Olga Millers MD Electronically signed by Olga Millers MD Signature Date/Time: 03/12/2023/2:19:21 PM    Final          ______________________________________________________________________________________________      Recent Labs: No results found for requested labs within last 365 days.   Lipid Panel    Component Value Date/Time   CHOL 90 (L) 05/26/2016 1455   TRIG 259 (H) 05/26/2016 1455   HDL 37 (L) 05/26/2016 1455   CHOLHDL 2.4 05/26/2016 1455   CHOLHDL 3.0 01/07/2016 1155   VLDL 60 (H) 01/07/2016 1155   LDLCALC 1 05/26/2016 1455   LDLDIRECT 61.7 07/06/2007 0912      ASSESSMENT:    1. Aortic valve stenosis, etiology of cardiac valve disease unspecified   2. Primary hypertension      PLAN:  In order of problems listed above:  #Aortic stenosis -Echo 03/12/2023 showed severe paradoxic low-flow low gradient aortic stenosis -She is completely asymptomatic at this time with normal LV size and  function on recent echo -I am going to get a coronary Ca score done for risk stratification which will also allow [place] to get an AV calcium score as well -I am going to refer her to structural heart team to be followed  as she will likely at some point need a SAVR vs TAVR at some point in the future -Repeat 2D echo in 6 months  #Hypertension -BP controlled on exam today -Continue prescription drug management with losartan HCT 100-12.5 mg daily with as needed refills   Time Spent: 20 minutes total time of encounter, including 15 minutes spent in face-to-face patient care on the date of this encounter. This time includes coordination of care and counseling regarding above mentioned problem list. Remainder of non-face-to-face time involved reviewing chart documents/testing relevant to the patient encounter and documentation in the medical record. I have independently reviewed documentation from referring provider  Followup:  6 months  Medication Adjustments/Labs and Tests Ordered: Current medicines are reviewed at length with the patient today.  Concerns regarding medicines are outlined above.  Medication changes, Labs and Tests ordered today are listed in the Patient Instructions below.  There are no Patient Instructions on file for this visit.   Signed, Armanda Magic, MD  08/17/2023 9:09 AM    [facility] [address] [place], [place], [place]  [address] Phone: [phone]; Fax: [phone]

## 2023-08-17 NOTE — Patient Instructions (Signed)
 Medication Instructions:  Your physician recommends that you continue on your current medications as directed. Please refer to the Current Medication list given to you today.  *If you need a refill on your cardiac medications before your next appointment, please call your pharmacy*  Lab Work: NONE If you have labs (blood work) drawn today and your tests are completely normal, you will receive your results only by: MyChart Message (if you have MyChart) OR A paper copy in the mail If you have any lab test that is abnormal or we need to change your treatment, we will call you to review the results.  Testing/Procedures: Echo in May Your physician has requested that you have an echocardiogram. Echocardiography is a painless test that uses sound waves to create images of your heart. It provides your doctor with information about the size and shape of your heart and how well your heart's chambers and valves are working. This procedure takes approximately one hour. There are no restrictions for this procedure. Please do NOT wear cologne, perfume, aftershave, or lotions (deodorant is allowed). Please arrive 15 minutes prior to your appointment time.  Please note: We ask at that you not bring children with you during ultrasound (echo/ vascular) testing. Due to room size and safety concerns, children are not allowed in the ultrasound rooms during exams. Our front office staff cannot provide observation of children in our lobby area while testing is being conducted. An adult accompanying a patient to their appointment will only be allowed in the ultrasound room at the discretion of the ultrasound technician under special circumstances. We apologize for any inconvenience.  Coronary Calcium Score Your physician has requested that you have a coronary calcium score performed. This is not covered by insurance and will be an out-of-pocket cost of approximately $99.   Follow-Up: At [facility], you  and your health needs are our priority.  As part of our continuing mission to provide you with exceptional heart care, our providers are all part of one team.  This team includes your primary Cardiologist (physician) and Advanced Practice Providers or APPs (Physician Assistants and Nurse Practitioners) who all work together to provide you with the care you need, when you need it.  Your next appointment:   6 month(s)  Provider:   Mayford Knife, MD  We recommend signing up for the patient portal called "MyChart".  Sign up information is provided on this After Visit Summary.  MyChart is used to connect with patients for Virtual Visits (Telemedicine).  Patients are able to view lab/test results, encounter notes, upcoming appointments, etc.  Non-urgent messages can be sent to your provider as well.   To learn more about what you can do with MyChart, go to [email or web address].   Other Instructions You have been referred to our structural team. Someone will be reaching out to make an appointment.      1st Floor: - Lobby - Registration  - Pharmacy  - Lab - Cafe  2nd Floor: - PV Lab - Diagnostic Testing (echo, CT, nuclear med)  3rd Floor: - Vacant  4th Floor: - TCTS (cardiothoracic surgery) - AFib Clinic - Structural Heart Clinic - Vascular Surgery  - Vascular Ultrasound  5th Floor: - HeartCare Cardiology (general and EP) - Clinical Pharmacy for coumadin, hypertension, lipid, weight-loss medications, and med management appointments    Valet parking services will be available as well.

## 2023-08-17 NOTE — Addendum Note (Signed)
 Addended by: Erick Alley on: 08/17/2023 09:30 AM   Modules accepted: Orders

## 2023-08-19 DIAGNOSIS — D229 Melanocytic nevi, unspecified: Secondary | ICD-10-CM | POA: Diagnosis not present

## 2023-08-19 DIAGNOSIS — L814 Other melanin hyperpigmentation: Secondary | ICD-10-CM | POA: Diagnosis not present

## 2023-08-19 DIAGNOSIS — D1801 Hemangioma of skin and subcutaneous tissue: Secondary | ICD-10-CM | POA: Diagnosis not present

## 2023-08-19 DIAGNOSIS — Z86007 Personal history of in-situ neoplasm of skin: Secondary | ICD-10-CM | POA: Diagnosis not present

## 2023-08-19 DIAGNOSIS — L821 Other seborrheic keratosis: Secondary | ICD-10-CM | POA: Diagnosis not present

## 2023-08-19 DIAGNOSIS — L918 Other hypertrophic disorders of the skin: Secondary | ICD-10-CM | POA: Diagnosis not present

## 2023-08-19 DIAGNOSIS — L578 Other skin changes due to chronic exposure to nonionizing radiation: Secondary | ICD-10-CM | POA: Diagnosis not present

## 2023-09-21 DIAGNOSIS — I5189 Other ill-defined heart diseases: Secondary | ICD-10-CM | POA: Diagnosis not present

## 2023-09-21 DIAGNOSIS — I350 Nonrheumatic aortic (valve) stenosis: Secondary | ICD-10-CM | POA: Diagnosis not present

## 2023-09-21 DIAGNOSIS — E782 Mixed hyperlipidemia: Secondary | ICD-10-CM | POA: Diagnosis not present

## 2023-09-21 DIAGNOSIS — E1165 Type 2 diabetes mellitus with hyperglycemia: Secondary | ICD-10-CM | POA: Diagnosis not present

## 2023-09-21 DIAGNOSIS — L219 Seborrheic dermatitis, unspecified: Secondary | ICD-10-CM | POA: Diagnosis not present

## 2023-09-21 DIAGNOSIS — M25552 Pain in left hip: Secondary | ICD-10-CM | POA: Diagnosis not present

## 2023-09-21 DIAGNOSIS — K580 Irritable bowel syndrome with diarrhea: Secondary | ICD-10-CM | POA: Diagnosis not present

## 2023-09-21 DIAGNOSIS — I10 Essential (primary) hypertension: Secondary | ICD-10-CM | POA: Diagnosis not present

## 2023-09-21 DIAGNOSIS — Z0000 Encounter for general adult medical examination without abnormal findings: Secondary | ICD-10-CM | POA: Diagnosis not present

## 2023-09-22 ENCOUNTER — Ambulatory Visit: Payer: Self-pay | Admitting: Cardiology

## 2023-09-22 ENCOUNTER — Encounter: Payer: Self-pay | Admitting: Cardiology

## 2023-09-22 ENCOUNTER — Ambulatory Visit (HOSPITAL_COMMUNITY): Attending: Cardiology

## 2023-09-22 DIAGNOSIS — I350 Nonrheumatic aortic (valve) stenosis: Secondary | ICD-10-CM | POA: Insufficient documentation

## 2023-09-22 LAB — ECHOCARDIOGRAM COMPLETE
AR max vel: 0.65 cm2
AV Area VTI: 0.66 cm2
AV Area mean vel: 0.6 cm2
AV Mean grad: 38.2 mmHg
AV Peak grad: 60.9 mmHg
Ao pk vel: 3.9 m/s
Area-P 1/2: 4.96 cm2
S' Lateral: 1.7 cm

## 2023-09-30 ENCOUNTER — Ambulatory Visit: Attending: Cardiovascular Disease | Admitting: Cardiovascular Disease

## 2023-09-30 ENCOUNTER — Encounter: Payer: Self-pay | Admitting: Cardiovascular Disease

## 2023-09-30 VITALS — BP 102/70 | HR 106 | Ht 63.0 in | Wt 143.2 lb

## 2023-09-30 DIAGNOSIS — I350 Nonrheumatic aortic (valve) stenosis: Secondary | ICD-10-CM | POA: Diagnosis not present

## 2023-09-30 NOTE — Patient Instructions (Signed)
 Medication Instructions:  No changes *If you need a refill on your cardiac medications before your next appointment, please call your pharmacy*  Lab Work: None today If you have labs (blood work) drawn today and your tests are completely normal, you will receive your results only by: MyChart Message (if you have MyChart) OR A paper copy in the mail If you have any lab test that is abnormal or we need to change your treatment, we will call you to review the results.  Testing/Procedures: ECHO DUE IN 6 MONTHS Your physician has requested that you have an echocardiogram. Echocardiography is a painless test that uses sound waves to create images of your heart. It provides your doctor with information about the size and shape of your heart and how well your heart's chambers and valves are working. This procedure takes approximately one hour. There are no restrictions for this procedure. Please do NOT wear cologne, perfume, aftershave, or lotions (deodorant is allowed). Please arrive 15 minutes prior to your appointment time.  Please note: We ask at that you not bring children with you during ultrasound (echo/ vascular) testing. Due to room size and safety concerns, children are not allowed in the ultrasound rooms during exams. Our front office staff cannot provide observation of children in our lobby area while testing is being conducted. An adult accompanying a patient to their appointment will only be allowed in the ultrasound room at the discretion of the ultrasound technician under special circumstances. We apologize for any inconvenience.   Follow-Up: At [facility], you and your health needs are our priority.  As part of our continuing mission to provide you with exceptional heart care, our providers are all part of one team.  This team includes your primary Cardiologist (physician) and Advanced Practice Providers or APPs (Physician Assistants and Nurse Practitioners) who all work  together to provide you with the care you need, when you need it.  Your next appointment:   6 month(s)  Provider:   Antoinette Batman, MD

## 2023-09-30 NOTE — Progress Notes (Signed)
 [facility] Note  Chief Complaint  Patient presents with   Follow-up    Aortic stenosis   History of Present Illness: 78 yo female with history of HTN, hyperlipidemia, DM and aortic stenosis who is here today for follow up. She was found to have mild aortic stenosis in 2021. She is followed in our office by Dr. Micael Adas. I met her in 2023 and discussed her aortic stenosis which was moderate at that time. Echo May 2025 with LVEF=70-75%. Severe aortic stenosis with mean gradient 38 mmHg, AVA 0.60 cm2, SVI 25, DI 0.26.   She tells me today that she has been feeling well. She denies any chest pain, dyspnea, palpitations, lower extremity edema, orthopnea, PND, dizziness, near syncope or syncope. She lives in [place] alone. Her son is in [place], [place]. Her daughter lives in [place]. She is retired from the [employer]. She has no active dental issues.   Primary Care Physician: Elester Grim, MD  Primary Cardiologist: Micael Adas  Past Medical History:  Diagnosis Date   Allergy    Aortic stenosis    severe low flow low gradient AS by echo 02/2023   Hyperlipidemia    Hypertension     Past Surgical History:  Procedure Laterality Date   CESAREAN SECTION     1 time   TUBAL LIGATION      Current Outpatient Medications  Medication Sig Dispense Refill   aspirin 81 MG tablet Take 81 mg by mouth daily.     gemfibrozil (LOPID) 600 MG tablet Take 600 mg by mouth daily.     INVOKANA 100 MG TABS tablet Take 100 mg by mouth daily.     Lactobacillus (PROBIOTIC ACIDOPHILUS PO) Take by mouth.     losartan-hydrochlorothiazide  (HYZAAR) 100-12.5 MG tablet Take 1 tablet by mouth daily.     OZEMPIC, 2 MG/DOSE, 8 MG/3ML SOPN Inject 2 mg into the skin once a week.     VASCEPA 1 g capsule Take 1 g by mouth in the morning, at noon, in the evening, and at bedtime.     No current facility-administered medications for this visit.    Allergies  Allergen Reactions   Penicillins     Social  History   Socioeconomic History   Marital status: Divorced    Spouse name: n/a   Number of children: 2   Years of education: college   Highest education level: Not on file  Occupational History   Occupation: retired    Comment: [employer]  Tobacco Use   Smoking status: Never   Smokeless tobacco: Never  Substance and Sexual Activity   Alcohol use: Yes    Alcohol/week: 0.0 standard drinks of alcohol    Comment: a glass of wine here and there   Drug use: No   Sexual activity: Not Currently  Other Topics Concern   Not on file  Social History Narrative   Lives alone.   Son lives in [place], [place]   Daughter lives in [place], [place]   Exercise: Yes.   Social Drivers of Corporate investment banker Strain: Not on file  Food Insecurity: Not on file  Transportation Needs: Not on file  Physical Activity: Not on file  Stress: Not on file  Social Connections: Not on file  Intimate Partner Violence: Not on file    Family History  Problem Relation Age of Onset   Diabetes Mother    Heart disease Father    Stroke Father     Review  of Systems:  As stated in the HPI and otherwise negative.   BP 102/70   Pulse (!) 106   Ht 5\' 3"  (1.6 m)   Wt 143 lb 3.2 oz (65 kg)   SpO2 96%   BMI 25.37 kg/m   Physical Examination: General: Well developed, well nourished, NAD  HEENT: OP clear, mucus membranes moist  SKIN: warm, dry. No rashes. Neuro: No focal deficits  Musculoskeletal: Muscle strength 5/5 all ext  Psychiatric: Mood and affect normal  Neck: No JVD, no carotid bruits, no thyromegaly, no lymphadenopathy.  Lungs:Clear bilaterally, no wheezes, rhonci, crackles Cardiovascular: Regular rate and rhythm.  Harsh systolic murmur.  Abdomen:Soft. Bowel sounds present. Non-tender.  Extremities: No lower extremity edema. Pulses are 2 + in the bilateral DP/PT.  EKG:  EKG is not ordered today. The ekg ordered today demonstrates   Echo 09/22/23: 1. Left ventricular ejection fraction, by  estimation, is 70 to 75%. The  left ventricle has hyperdynamic function. The left ventricle has no  regional wall motion abnormalities. Left ventricular diastolic parameters  are consistent with Grade I diastolic  dysfunction (impaired relaxation).   2. Right ventricular systolic function is normal. The right ventricular  size is normal.   3. The mitral valve is normal in structure. No evidence of mitral valve  regurgitation. No evidence of mitral stenosis.   4. The aortic valve is calcified. Aortic valve regurgitation is not  visualized. Severe aortic valve stenosis. Aortic valve area, by VTI  measures 0.66 cm. Aortic valve mean gradient measures 38.2 mmHg. Aortic  valve Vmax measures 3.90 m/s.   5. The inferior vena cava is normal in size with greater than 50%  respiratory variability, suggesting right atrial pressure of 3 mmHg.   Comparison(s): Prior AV mean gradient 28.5 mmHg.   FINDINGS   Left Ventricle: Left ventricular ejection fraction, by estimation, is 70  to 75%. The left ventricle has hyperdynamic function. The left ventricle  has no regional wall motion abnormalities. The left ventricular internal  cavity size was normal in size.  There is no left ventricular hypertrophy. Left ventricular diastolic  parameters are consistent with Grade I diastolic dysfunction (impaired  relaxation).   Right Ventricle: The right ventricular size is normal. No increase in  right ventricular wall thickness. Right ventricular systolic function is  normal.   Left Atrium: Left atrial size was normal in size.   Right Atrium: Right atrial size was normal in size.   Pericardium: There is no evidence of pericardial effusion.   Mitral Valve: The mitral valve is normal in structure. No evidence of  mitral valve regurgitation. No evidence of mitral valve stenosis.   Tricuspid Valve: The tricuspid valve is normal in structure. Tricuspid  valve regurgitation is not demonstrated. No evidence of  tricuspid  stenosis.   The aortic valve is calcified. Aortic valve regurgitation is not  visualized. Severe aortic stenosis is present.  Pulmonic Valve: The pulmonic valve was normal in structure. Pulmonic valve  regurgitation is not visualized. No evidence of pulmonic stenosis.   Aorta: The aortic root is normal in size and structure.   Venous: The inferior vena cava is normal in size with greater than 50%  respiratory variability, suggesting right atrial pressure of 3 mmHg.   IAS/Shunts: No atrial level shunt detected by color flow Doppler.     LEFT VENTRICLE  PLAX 2D  LVIDd:         2.60 cm   Diastology  LVIDs:  1.70 cm   LV e' medial:    6.05 cm/s  LV PW:         1.30 cm   LV E/e' medial:  9.3  LV IVS:        1.30 cm   LV e' lateral:   7.21 cm/s  LVOT diam:     1.80 cm   LV E/e' lateral: 7.8  LV SV:         42  LV SV Index:   25  LVOT Area:     2.54 cm     RIGHT VENTRICLE             IVC  RV Basal diam:  2.80 cm     IVC diam: 1.30 cm  RV S prime:     14.83 cm/s  TAPSE (M-mode): 1.2 cm   LEFT ATRIUM             Index        RIGHT ATRIUM          Index  LA diam:        3.60 cm 2.12 cm/m   RA Area:     8.54 cm  LA Vol (A2C):   25.2 ml 14.85 ml/m  RA Volume:   18.40 ml 10.85 ml/m  LA Vol (A4C):   16.1 ml 9.49 ml/m  LA Biplane Vol: 21.0 ml 12.38 ml/m   AORTIC VALVE  AV Area (Vmax):    0.65 cm  AV Area (Vmean):   0.60 cm  AV Area (VTI):     0.66 cm  AV Vmax:           390.20 cm/s  AV Vmean:          294.000 cm/s  AV VTI:            0.642 m  AV Peak Grad:      60.9 mmHg  AV Mean Grad:      38.2 mmHg  LVOT Vmax:         99.10 cm/s  LVOT Vmean:        68.833 cm/s  LVOT VTI:          0.167 m  LVOT/AV VTI ratio: 0.26    AORTA  Ao Root diam: 3.10 cm  Ao Asc diam:  3.50 cm   MITRAL VALVE  MV Area (PHT): 4.96 cm    SHUNTS  MV Decel Time: 153 msec    Systemic VTI:  0.17 m  MV E velocity: 56.35 cm/s  Systemic Diam: 1.80 cm  MV A velocity: 95.15 cm/s   MV E/A ratio:  0.59   Recent Labs: No results found for requested labs within last 365 days.    Wt Readings from Last 3 Encounters:  09/30/23 143 lb 3.2 oz (65 kg)  08/17/23 147 lb (66.7 kg)  03/27/22 145 lb 3.2 oz (65.9 kg)    Assessment and Plan:   1. Severe Aortic Valve Stenosis: She has severe, stage C aortic valve stenosis. NYHA class 1. I have personally reviewed the echo images. The aortic valve is thickened and calcified with limited leaflet mobility. I think she would benefit from AVR when she becomes symptomatic. Given advanced age, she is not a good candidate for conventional AVR by surgical approach. I think she may be a good candidate for TAVR.   I have reviewed the natural history of aortic stenosis with the patient and their family members  who are present today. We  have discussed the limitations of medical therapy and the poor prognosis associated with symptomatic aortic stenosis. We have reviewed potential treatment options, including palliative medical therapy, conventional surgical aortic valve replacement, and transcatheter aortic valve replacement. We discussed treatment options in the context of the patient's specific comorbid medical conditions.   I will repeat her echo in 6 months. I will see her after the echo. She will call with any changes in her clinical status.    Labs/ tests ordered today include:   Orders Placed This Encounter  Procedures   ECHOCARDIOGRAM COMPLETE   Disposition:   F/U with me in 6 months following the next echo  Signed, Antoinette Batman, MD, [facility] 09/30/2023 12:11 PM    [facility] [address] [place], [place], [place]  [address] Phone: [phone]; Fax: [phone]

## 2023-09-30 NOTE — Progress Notes (Signed)
 Pre Surgical Assessment: 5 M Walk Test  20M=16.17ft  5 Meter Walk Test- trial 1: 6.75 seconds 5 Meter Walk Test- trial 2: 6.43 seconds 5 Meter Walk Test- trial 3: 6.38 seconds 5 Meter Walk Test Average: 6.52 seconds

## 2023-10-11 ENCOUNTER — Other Ambulatory Visit (HOSPITAL_BASED_OUTPATIENT_CLINIC_OR_DEPARTMENT_OTHER)

## 2023-12-21 DIAGNOSIS — L570 Actinic keratosis: Secondary | ICD-10-CM | POA: Diagnosis not present

## 2023-12-21 DIAGNOSIS — T50905A Adverse effect of unspecified drugs, medicaments and biological substances, initial encounter: Secondary | ICD-10-CM | POA: Diagnosis not present

## 2024-03-16 DIAGNOSIS — Z1231 Encounter for screening mammogram for malignant neoplasm of breast: Secondary | ICD-10-CM | POA: Diagnosis not present

## 2024-03-20 ENCOUNTER — Ambulatory Visit: Payer: Self-pay | Admitting: Cardiovascular Disease

## 2024-03-20 ENCOUNTER — Ambulatory Visit (HOSPITAL_COMMUNITY)
Admission: RE | Admit: 2024-03-20 | Discharge: 2024-03-20 | Disposition: A | Discharge status: Home / Self Care | Source: Ambulatory Visit | Attending: Cardiovascular Disease | Admitting: Cardiovascular Disease

## 2024-03-20 DIAGNOSIS — I350 Nonrheumatic aortic (valve) stenosis: Secondary | ICD-10-CM | POA: Insufficient documentation

## 2024-03-20 LAB — ECHOCARDIOGRAM COMPLETE
AR max vel: 0.75 cm2
AV Area VTI: 0.73 cm2
AV Area mean vel: 0.73 cm2
AV Mean grad: 43 mmHg
AV Peak grad: 72.8 mmHg
Ao pk vel: 4.27 m/s
Area-P 1/2: 4.57 cm2
S' Lateral: 2 cm

## 2024-03-23 DIAGNOSIS — E1165 Type 2 diabetes mellitus with hyperglycemia: Secondary | ICD-10-CM | POA: Diagnosis not present

## 2024-03-23 DIAGNOSIS — E782 Mixed hyperlipidemia: Secondary | ICD-10-CM | POA: Diagnosis not present

## 2024-03-23 DIAGNOSIS — K580 Irritable bowel syndrome with diarrhea: Secondary | ICD-10-CM | POA: Diagnosis not present

## 2024-03-23 DIAGNOSIS — I5189 Other ill-defined heart diseases: Secondary | ICD-10-CM | POA: Diagnosis not present

## 2024-03-23 DIAGNOSIS — Z23 Encounter for immunization: Secondary | ICD-10-CM | POA: Diagnosis not present

## 2024-03-23 DIAGNOSIS — R142 Eructation: Secondary | ICD-10-CM | POA: Diagnosis not present

## 2024-03-23 DIAGNOSIS — I350 Nonrheumatic aortic (valve) stenosis: Secondary | ICD-10-CM | POA: Diagnosis not present

## 2024-03-23 DIAGNOSIS — I10 Essential (primary) hypertension: Secondary | ICD-10-CM | POA: Diagnosis not present

## 2024-03-27 ENCOUNTER — Ambulatory Visit: Attending: Cardiovascular Disease | Admitting: Cardiovascular Disease

## 2024-03-27 ENCOUNTER — Encounter: Payer: Self-pay | Admitting: Cardiovascular Disease

## 2024-03-27 VITALS — BP 120/78 | HR 100 | Ht 63.0 in | Wt 142.6 lb

## 2024-03-27 DIAGNOSIS — I350 Nonrheumatic aortic (valve) stenosis: Secondary | ICD-10-CM | POA: Diagnosis not present

## 2024-03-27 NOTE — Progress Notes (Signed)
 Structural Heart Clinic Note  Chief Complaint  Patient presents with   Follow-up    Aortic stenosis   History of Present Illness: 78 yo [name] with history of HTN, hyperlipidemia, DM and aortic stenosis who is here today for follow up. Alice [name] was found to have mild aortic stenosis in 2021. Alice [name] is followed in our office by Dr. Shlomo. I met her in 2023 and discussed her aortic stenosis which was moderate at that time. Echo May 2025 with LVEF=70-75%. Severe aortic stenosis with mean gradient 38 mmHg, AVA 0.60 cm2, SVI 25, DI 0.26. We elected to follow her aortic stenosis. Echo 03/20/24 with LVEF=70-75%. Severe aortic stenosis with mean gradient 43 mmHg, AVA 0.73 cm2, SVI 33, DI 0.23.   Alice [name] is here today for follow up. The patient denies any chest pain, dyspnea, palpitations, lower extremity edema, orthopnea, PND, dizziness, near syncope or syncope.   Alice [name] lives in [place] alone. Her son is in [place], [place]. Her daughter lives in [place]. Alice [name] is retired from the [employer]. Alice [name] has no active dental issues.   Primary Care Physician: Vernon Velna SAUNDERS, MD  Primary Cardiologist: Shlomo  Past Medical History:  Diagnosis Date   Allergy    Aortic stenosis    severe low flow low gradient AS by echo 02/2023   Hyperlipidemia    Hypertension     Past Surgical History:  Procedure Laterality Date   CESAREAN SECTION     1 time   TUBAL LIGATION      Current Outpatient Medications  Medication Sig Dispense Refill   aspirin 81 MG tablet Take 81 mg by mouth daily.     gemfibrozil (LOPID) 600 MG tablet Take 600 mg by mouth daily.     INVOKANA 100 MG TABS tablet Take 100 mg by mouth daily.     losartan-hydrochlorothiazide  (HYZAAR) 100-12.5 MG tablet Take 1 tablet by mouth daily.     OZEMPIC, 2 MG/DOSE, 8 MG/3ML SOPN Inject 2 mg into the skin once a week.     VASCEPA 1 g capsule Take 1 g by mouth in the morning, at noon, in the evening, and at bedtime.     Lactobacillus (PROBIOTIC ACIDOPHILUS PO)  Take by mouth.     No current facility-administered medications for this visit.    Allergies  Allergen Reactions   Penicillins     Social History   Socioeconomic History   Marital status: Divorced    Spouse name: n/a   Number of children: 2   Years of education: college   Highest education level: Not on file  Occupational History   Occupation: retired    Comment: [employer]  Tobacco Use   Smoking status: Never   Smokeless tobacco: Never  Substance and Sexual Activity   Alcohol use: Yes    Alcohol/week: 0.0 standard drinks of alcohol    Comment: a glass of wine here and there   Drug use: No   Sexual activity: Not Currently  Other Topics Concern   Not on file  Social History Narrative   Lives alone.   Son lives in [place], [place]   Daughter lives in [place], [place]   Exercise: Yes.   Social Drivers of Corporate Investment Banker Strain: Not on file  Food Insecurity: Not on file  Transportation Needs: Not on file  Physical Activity: Not on file  Stress: Not on file  Social Connections: Not on file  Intimate Partner Violence: Not on file    Family History  Problem Relation Age of Onset   Diabetes Mother    Heart disease Father    Stroke Father     Review of Systems:  As stated in the HPI and otherwise negative.   BP 120/78   Pulse 100   Ht 5' 3 (1.6 m)   Wt 142 lb 9.6 oz (64.7 kg)   SpO2 97%   BMI 25.26 kg/m   Physical Examination: General: Well developed, well nourished, NAD  HEENT: OP clear, mucus membranes moist  SKIN: warm, dry. No rashes. Neuro: No focal deficits  Musculoskeletal: Muscle strength 5/5 all ext  Psychiatric: Mood and affect normal  Neck: No JVD, no carotid bruits, no thyromegaly, no lymphadenopathy.  Lungs:Clear bilaterally, no wheezes, rhonci, crackles Cardiovascular: Regular rate and rhythm. Harsh systolic murmur.  Abdomen:Soft. Bowel sounds present. Non-tender.  Extremities: No lower extremity edema. Pulses are 2 + in the  bilateral DP/PT.  EKG:  EKG is not ordered today. The ekg ordered today demonstrates   Echo November 2025:  1. Left ventricular ejection fraction, by estimation, is 70 to 75%. Left  ventricular ejection fraction by 3D volume is 71 %. The left ventricle has  hyperdynamic function. The left ventricle has no regional wall motion  abnormalities. There is mild  concentric left ventricular hypertrophy. Left ventricular diastolic  parameters are consistent with Grade I diastolic dysfunction (impaired  relaxation). The average left ventricular global longitudinal strain is  -21.1 %. The global longitudinal strain is  normal.   2. Right ventricular systolic function is normal. The right ventricular  size is normal. Tricuspid regurgitation signal is inadequate for assessing  PA pressure.   3. The mitral valve is normal in structure. No evidence of mitral valve  regurgitation. No evidence of mitral stenosis.   4. The aortic valve has an indeterminant number of cusps. There is severe  calcifcation of the aortic valve. There is severe thickening of the aortic  valve. Aortic valve regurgitation is not visualized. Severe aortic valve  stenosis. Aortic valve mean  gradient measures 43.0 mmHg. Aortic valve Vmax measures 4.27 m/s.   5. The inferior vena cava is normal in size with greater than 50%  respiratory variability, suggesting right atrial pressure of 3 mmHg.   Comparison(s): Aortic valve gradients have worsened further.   FINDINGS   Left Ventricle: Left ventricular ejection fraction, by estimation, is 70  to 75%. Left ventricular ejection fraction by 3D volume is 71 %. The left  ventricle has hyperdynamic function. The left ventricle has no regional  wall motion abnormalities. The  average left ventricular global longitudinal strain is -21.1 %. Strain was  performed and the global longitudinal strain is normal. The left  ventricular internal cavity size was normal in size. There is mild   concentric left ventricular hypertrophy. Left  ventricular diastolic parameters are consistent with Grade I diastolic  dysfunction (impaired relaxation). Indeterminate filling pressures.   Right Ventricle: The right ventricular size is normal. No increase in  right ventricular wall thickness. Right ventricular systolic function is  normal. Tricuspid regurgitation signal is inadequate for assessing PA  pressure.   Left Atrium: Left atrial size was normal in size.   Right Atrium: Right atrial size was normal in size.   Pericardium: There is no evidence of pericardial effusion.   Mitral Valve: The mitral valve is normal in structure. No evidence of  mitral valve regurgitation. No evidence of mitral valve stenosis.   Tricuspid Valve: The tricuspid valve is normal in structure. Tricuspid  valve regurgitation is not demonstrated.   Aortic Valve: The aortic valve has an indeterminant number of cusps. There  is severe calcifcation of the aortic valve. There is severe thickening of  the aortic valve. Aortic valve regurgitation is not visualized. Severe  aortic stenosis is present. Aortic   valve mean gradient measures 43.0 mmHg. Aortic valve peak gradient  measures 72.8 mmHg. Aortic valve area, by VTI measures 0.73 cm.   Pulmonic Valve: The pulmonic valve was not well visualized. Pulmonic valve  regurgitation is not visualized. No evidence of pulmonic stenosis.   Aorta: The aortic root and ascending aorta are structurally normal, with  no evidence of dilitation.   Venous: The inferior vena cava is normal in size with greater than 50%  respiratory variability, suggesting right atrial pressure of 3 mmHg.   IAS/Shunts: No atrial level shunt detected by color flow Doppler.   Additional Comments: 3D was performed not requiring image post processing  on an independent workstation and was normal.     LEFT VENTRICLE  PLAX 2D  LVIDd:         2.80 cm         Diastology  LVIDs:          2.00 cm         LV e' medial:    4.79 cm/s  LV PW:         1.20 cm         LV E/e' medial:  12.6  LV IVS:        1.20 cm         LV e' lateral:   5.82 cm/s  LVOT diam:     2.00 cm         LV E/e' lateral: 10.4  LV SV:         56  LV SV Index:   33              2D Longitudinal  LVOT Area:     3.14 cm        Strain                                 2D Strain GLS   -18.0 %                                 (A4C):                                 2D Strain GLS   -20.6 %                                 (A3C):                                 2D Strain GLS   -24.5 %                                 (A2C):  2D Strain GLS   -21.1 %                                 Avg:                                   3D Volume EF                                 LV 3D EF:    Left                                              ventricul                                              ar                                              ejection                                              fraction                                              by 3D                                              volume is                                              71 %.                                   3D Volume EF:                                 3D EF:        71 %                                 LV EDV:       68 ml                                 LV ESV:  20 ml                                 LV SV:        48 ml   RIGHT VENTRICLE             IVC  RV Basal diam:  2.10 cm     IVC diam: 2.10 cm  RV S prime:     12.20 cm/s  TAPSE (M-mode): 1.5 cm   LEFT ATRIUM             Index  LA diam:        3.40 cm 2.03 cm/m  LA Vol (A2C):   28.3 ml 16.87 ml/m  LA Vol (A4C):   13.4 ml 7.99 ml/m  LA Biplane Vol: 20.2 ml 12.04 ml/m   AORTIC VALVE  AV Area (Vmax):    0.75 cm  AV Area (Vmean):   0.73 cm  AV Area (VTI):     0.73 cm  AV Vmax:           426.50 cm/s  AV Vmean:          308.000 cm/s  AV VTI:             0.767 m  AV Peak Grad:      72.8 mmHg  AV Mean Grad:      43.0 mmHg  LVOT Vmax:         102.00 cm/s  LVOT Vmean:        71.300 cm/s  LVOT VTI:          0.178 m  LVOT/AV VTI ratio: 0.23    AORTA  Ao Root diam: 2.90 cm  Ao Asc diam:  3.50 cm   MITRAL VALVE  MV Area (PHT): 4.57 cm     SHUNTS  MV Decel Time: 166 msec     Systemic VTI:  0.18 m  MV E velocity: 60.30 cm/s   Systemic Diam: 2.00 cm  MV A velocity: 102.00 cm/s  MV E/A ratio:  0.59   Recent Labs: No results found for requested labs within last 365 days.    Wt Readings from Last 3 Encounters:  03/27/24 142 lb 9.6 oz (64.7 kg)  09/30/23 143 lb 3.2 oz (65 kg)  08/17/23 147 lb (Alice.7 kg)    Assessment and Plan:   1. Severe Aortic Valve Stenosis:   Alice [name] has severe aortic stenosis. The aortic valve has mild thickening and is seen to open fairly well. Her echo data suggests that Alice [name] is just into the severe range. Alice [name] remains asymptomatic. When Alice [name] becomes symptomatic, Alice [name] should be a candidate for TAVR. Given advanced age, Alice [name] is not a good candidate for conventional AVR by surgical approach.   I have reviewed the natural history of aortic stenosis with the patient and their family members  who are present today. We have discussed the limitations of medical therapy and the poor prognosis associated with symptomatic aortic stenosis. We have reviewed potential treatment options, including palliative medical therapy, conventional surgical aortic valve replacement, and transcatheter aortic valve replacement. We discussed treatment options in the context of the patient's specific comorbid medical conditions.   I will repeat her echo in 6 months and see her after the echo.    Labs/ tests ordered today include:   Orders Placed This Encounter  Procedures   ECHOCARDIOGRAM COMPLETE   Disposition:   F/U  with me in 6 months following the next echo  Signed, Lonni Cash, MD, [facility] 03/27/2024 12:21 PM    [facility] [address] [place], [place], [place]  [address] Phone: [phone]; Fax: [phone]

## 2024-03-27 NOTE — Patient Instructions (Signed)
 Medication Instructions:  Your physician recommends that you continue on your current medications as directed. Please refer to the Current Medication list given to you today.  *If you need a refill on your cardiac medications before your next appointment, please call your pharmacy*  Lab Work: none If you have labs (blood work) drawn today and your tests are completely normal, you will receive your results only by: MyChart Message (if you have MyChart) OR A paper copy in the mail If you have any lab test that is abnormal or we need to change your treatment, we will call you to review the results.  Testing/Procedures: Your physician has requested that you have an echocardiogram in 6 months. . Echocardiography is a painless test that uses sound waves to create images of your heart. It provides your doctor with information about the size and shape of your heart and how well your heart's chambers and valves are working. This procedure takes approximately one hour. There are no restrictions for this procedure. Please do NOT wear cologne, perfume, aftershave, or lotions (deodorant is allowed). Please arrive 15 minutes prior to your appointment time.  Please note: We ask at that you not bring children with you during ultrasound (echo/ vascular) testing. Due to room size and safety concerns, children are not allowed in the ultrasound rooms during exams. Our front office staff cannot provide observation of children in our lobby area while testing is being conducted. An adult accompanying a patient to their appointment will only be allowed in the ultrasound room at the discretion of the ultrasound technician under special circumstances. We apologize for any inconvenience.   Follow-Up: At [facility], you and your health needs are our priority.  As part of our continuing mission to provide you with exceptional heart care, our providers are all part of one team.  This team includes your primary  Cardiologist (physician) and Advanced Practice Providers or APPs (Physician Assistants and Nurse Practitioners) who all work together to provide you with the care you need, when you need it.  Your next appointment:   6 -7 month(s)--after echo  Provider:   Dr Verlin   We recommend signing up for the patient portal called MyChart.  Sign up information is provided on this After Visit Summary.  MyChart is used to connect with patients for Virtual Visits (Telemedicine).  Patients are able to view lab/test results, encounter notes, upcoming appointments, etc.  Non-urgent messages can be sent to your provider as well.   To learn more about what you can do with MyChart, go to [email or web address].   Other Instructions

## 2024-03-28 DIAGNOSIS — R928 Other abnormal and inconclusive findings on diagnostic imaging of breast: Secondary | ICD-10-CM | POA: Diagnosis not present

## 2024-09-25 ENCOUNTER — Ambulatory Visit (HOSPITAL_COMMUNITY)

## 2024-10-04 ENCOUNTER — Ambulatory Visit: Admitting: Cardiovascular Disease
